# Patient Record
Sex: Female | Born: 1960 | Race: White | Hispanic: No | Smoking: Former smoker
Health system: Southern US, Community
[De-identification: ages and names within clinical notes are randomized; demographics above are authoritative.]

## PROBLEM LIST (undated history)

## (undated) DIAGNOSIS — F32A Depression, unspecified: Secondary | ICD-10-CM

## (undated) DIAGNOSIS — I10 Essential (primary) hypertension: Secondary | ICD-10-CM

## (undated) DIAGNOSIS — F329 Major depressive disorder, single episode, unspecified: Secondary | ICD-10-CM

## (undated) DIAGNOSIS — E119 Type 2 diabetes mellitus without complications: Secondary | ICD-10-CM

## (undated) DIAGNOSIS — E785 Hyperlipidemia, unspecified: Secondary | ICD-10-CM

## (undated) HISTORY — DX: Major depressive disorder, single episode, unspecified: F32.9

## (undated) HISTORY — DX: Hyperlipidemia, unspecified: E78.5

## (undated) HISTORY — DX: Type 2 diabetes mellitus without complications: E11.9

## (undated) HISTORY — DX: Depression, unspecified: F32.A

## (undated) HISTORY — DX: Essential (primary) hypertension: I10

## (undated) HISTORY — PX: ECTOPIC PREGNANCY SURGERY: SHX613

---

## 2017-10-04 ENCOUNTER — Encounter: Payer: Self-pay | Admitting: Physician Assistant

## 2017-10-04 ENCOUNTER — Other Ambulatory Visit: Payer: Self-pay | Admitting: Physician Assistant

## 2017-10-04 ENCOUNTER — Ambulatory Visit: Payer: Self-pay | Admitting: Physician Assistant

## 2017-10-04 VITALS — BP 160/90 | HR 83 | Temp 97.9°F | Ht 61.0 in | Wt 181.0 lb

## 2017-10-04 DIAGNOSIS — M25511 Pain in right shoulder: Secondary | ICD-10-CM

## 2017-10-04 DIAGNOSIS — E669 Obesity, unspecified: Secondary | ICD-10-CM

## 2017-10-04 DIAGNOSIS — Z1239 Encounter for other screening for malignant neoplasm of breast: Secondary | ICD-10-CM

## 2017-10-04 DIAGNOSIS — I10 Essential (primary) hypertension: Secondary | ICD-10-CM

## 2017-10-04 DIAGNOSIS — G8929 Other chronic pain: Secondary | ICD-10-CM

## 2017-10-04 DIAGNOSIS — E785 Hyperlipidemia, unspecified: Secondary | ICD-10-CM

## 2017-10-04 DIAGNOSIS — Z7689 Persons encountering health services in other specified circumstances: Secondary | ICD-10-CM

## 2017-10-04 DIAGNOSIS — E1165 Type 2 diabetes mellitus with hyperglycemia: Secondary | ICD-10-CM

## 2017-10-04 DIAGNOSIS — Z1211 Encounter for screening for malignant neoplasm of colon: Secondary | ICD-10-CM

## 2017-10-04 DIAGNOSIS — Z87891 Personal history of nicotine dependence: Secondary | ICD-10-CM

## 2017-10-04 MED ORDER — ATENOLOL 25 MG PO TABS: 25.0000 mg | ORAL_TABLET | Freq: Every day | ORAL | 1 refills | Status: DC

## 2017-10-04 NOTE — Progress Notes (Signed)
BP (!) 160/90 (BP Location: Left Arm, Patient Position: Sitting, Cuff Size: Normal)   Pulse 83   Temp 97.9 F (36.6 C)   Ht 5\' 1"  (1.549 m)   Wt 181 lb (82.1 kg)   SpO2 98%   BMI 34.20 kg/m    Subjective:    Patient ID: Ashley Chase, female    DOB: [birth year], 57 y.o.   MRN: [id number]  HPI: Ashley Chase is a 57 y.o. female presenting on 10/04/2017 for New Patient (Initial Visit) (pt moved from [facility] 3 months ago. pt has been out of BP med for 3 weeks) and Shoulder Pain (Left shoulder for 5 months. pt states she feels pain is getting worse)   HPI   Chief Complaint  Patient presents with  . New Patient (Initial Visit)    pt moved from [facility] 3 months ago. pt has been out of BP med for 3 weeks  . Shoulder Pain    Left shoulder for 5 months. 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  She does not have insurance.  Pt states on victoza and januvia for about a year.  She states recently am fbs about 167.    She was taken off the metformin when she started the other two.  She doesn't know why it was stopped.   She thinks her last a1c was 11.1 in November or December.    She says she did well on the 500 but had some diarrhea when they increased it to 1g bid.    Pt has never had a colonoscopy.   She says last PAP 2 years ago and it was normal.  Last mammogram 1 year ago was normal.    She takes aleve at night for her shoulder.   She is using ice and heat.    Relevant past medical, surgical, family and social history reviewed and updated as indicated. Interim medical history since our last visit reviewed. Allergies and medications reviewed and updated.   Current Outpatient Medications:  .  amLODipine (NORVASC) 5 MG tablet, Take 5 mg by mouth daily., Disp: , Rfl:  .  atorvastatin (LIPITOR) 10 MG tablet, Take 10 mg by mouth daily., Disp: , Rfl:  .  liraglutide (VICTOZA) 18 MG/3ML SOPN, Inject 1.8 mg into the skin daily., Disp: , Rfl:  .   lisinopril-hydrochlorothiazide (PRINZIDE,ZESTORETIC) 20-25 MG tablet, Take 1 tablet by mouth daily., Disp: , Rfl:  .  atenolol (TENORMIN) 25 MG tablet, Take 25 mg by mouth daily., Disp: , Rfl:  .  sitaGLIPtin (JANUVIA) 100 MG tablet, Take 100 mg by mouth daily., Disp: , Rfl:    Review of Systems  Constitutional: Negative for appetite change, chills, diaphoresis, fatigue, fever and unexpected weight change.  HENT: Negative for congestion, dental problem, drooling, ear pain, facial swelling, hearing loss, mouth sores, sneezing, sore throat, trouble swallowing and voice change.   Eyes: Positive for itching. Negative for pain, discharge, redness and visual disturbance.  Respiratory: Negative for cough, choking, shortness of breath and wheezing.   Cardiovascular: Negative for chest pain, palpitations and leg swelling.  Gastrointestinal: Negative for abdominal pain, blood in stool, constipation, diarrhea and vomiting.  Endocrine: Negative for cold intolerance, heat intolerance and polydipsia.  Genitourinary: Negative for decreased urine volume, dysuria and hematuria.  Musculoskeletal: Negative for arthralgias, back pain and gait problem.  Skin: Negative for rash.  Allergic/Immunologic: Negative for environmental allergies.  Neurological: Negative for seizures,  syncope, light-headedness and headaches.  Hematological: Negative for adenopathy.  Psychiatric/Behavioral: Negative for agitation, dysphoric mood and suicidal ideas. The patient is nervous/anxious.     Per HPI unless specifically indicated above     Objective:    BP (!) 160/90 (BP Location: Left Arm, Patient Position: Sitting, Cuff Size: Normal)   Pulse 83   Temp 97.9 F (36.6 C)   Ht 5\' 1"  (1.549 m)   Wt 181 lb (82.1 kg)   SpO2 98%   BMI 34.20 kg/m   Wt Readings from Last 3 Encounters:  10/04/17 181 lb (82.1 kg)    Physical Exam  Constitutional: She is oriented to person, place, and time. She appears well-developed and  well-nourished.  HENT:  Head: Normocephalic and atraumatic.  Mouth/Throat: Oropharynx is clear and moist. No oropharyngeal exudate.  Dry flaking skin B ear canals  Eyes: Pupils are equal, round, and reactive to light. Conjunctivae and EOM are normal.  Neck: Neck supple. No thyromegaly present.  Cardiovascular: Normal rate and regular rhythm.  Pulmonary/Chest: Effort normal and breath sounds normal.  Abdominal: Soft. Bowel sounds are normal. She exhibits no mass. There is no hepatosplenomegaly. There is no tenderness.  Musculoskeletal: She exhibits no edema.  Lymphadenopathy:    She has no cervical adenopathy.  Neurological: She is alert and oriented to person, place, and time. Gait normal.  Skin: Skin is warm and dry.  Psychiatric: She has a normal mood and affect. Her behavior is normal.  Vitals reviewed.   No results found for this or any previous visit.    Assessment & Plan:    Encounter Diagnoses  Name Primary?  . Encounter to establish care Yes  . Uncontrolled type 2 diabetes mellitus with hyperglycemia (HCC)   . Essential hypertension   . Hyperlipidemia, unspecified hyperlipidemia type   . Obesity, unspecified classification, unspecified obesity type, unspecified whether serious comorbidity present   . Screening for colon cancer   . History of cigarette smoking   . Screening for breast cancer   . Chronic right shoulder pain     -will get fasting baseline Labs -pt was given iFOBT for colon cancer screening. -ordered screening Mammogram -pt is signed up for medassist -[facility], mammogram and shoulder xray -pt was given refill of atenolol.  She says she has enough of her other meds to last until next OV.  -Follow up 1 month.  RTO sooner prn

## 2017-10-05 ENCOUNTER — Other Ambulatory Visit: Payer: Self-pay | Admitting: Physician Assistant

## 2017-10-05 DIAGNOSIS — Z1231 Encounter for screening mammogram for malignant neoplasm of breast: Secondary | ICD-10-CM

## 2017-10-18 ENCOUNTER — Other Ambulatory Visit (HOSPITAL_COMMUNITY)
Admission: RE | Admit: 2017-10-18 | Discharge: 2017-10-18 | Disposition: A | Discharge status: Home / Self Care | Payer: Self-pay | Source: Ambulatory Visit | Attending: Physician Assistant | Admitting: Physician Assistant

## 2017-10-18 DIAGNOSIS — E669 Obesity, unspecified: Secondary | ICD-10-CM | POA: Insufficient documentation

## 2017-10-18 DIAGNOSIS — E785 Hyperlipidemia, unspecified: Secondary | ICD-10-CM | POA: Insufficient documentation

## 2017-10-18 DIAGNOSIS — I10 Essential (primary) hypertension: Secondary | ICD-10-CM | POA: Insufficient documentation

## 2017-10-18 DIAGNOSIS — E1165 Type 2 diabetes mellitus with hyperglycemia: Secondary | ICD-10-CM | POA: Insufficient documentation

## 2017-10-18 LAB — COMPREHENSIVE METABOLIC PANEL
ALK PHOS: 153 U/L — AB (ref 38–126)
ALT: 30 U/L (ref 14–54)
AST: 26 U/L (ref 15–41)
Albumin: 4.2 g/dL (ref 3.5–5.0)
Anion gap: 11 (ref 5–15)
BILIRUBIN TOTAL: 0.5 mg/dL (ref 0.3–1.2)
BUN: 17 mg/dL (ref 6–20)
CALCIUM: 9.2 mg/dL (ref 8.9–10.3)
CO2: 28 mmol/L (ref 22–32)
Chloride: 100 mmol/L — ABNORMAL LOW (ref 101–111)
Creatinine, Ser: 0.59 mg/dL (ref 0.44–1.00)
GLUCOSE: 197 mg/dL — AB (ref 65–99)
POTASSIUM: 4.7 mmol/L (ref 3.5–5.1)
Sodium: 139 mmol/L (ref 135–145)
TOTAL PROTEIN: 7.5 g/dL (ref 6.5–8.1)

## 2017-10-18 LAB — LIPID PANEL
CHOL/HDL RATIO: 4.6 ratio
Cholesterol: 166 mg/dL (ref 0–200)
HDL: 36 mg/dL — AB (ref >40)
LDL Cholesterol: 77 mg/dL (ref 0–99)
TRIGLYCERIDES: 265 mg/dL — AB (ref <150)
VLDL: 53 mg/dL — ABNORMAL HIGH (ref 0–40)

## 2017-10-18 LAB — HEMOGLOBIN A1C
HEMOGLOBIN A1C: 10 % — AB (ref 4.8–5.6)
MEAN PLASMA GLUCOSE: 240.3 mg/dL

## 2017-10-18 LAB — CBC WITH DIFFERENTIAL/PLATELET
Basophils Absolute: 0 10*3/uL (ref 0.0–0.1)
Basophils Relative: 0 %
EOS ABS: 0.2 10*3/uL (ref 0.0–0.7)
EOS PCT: 3 %
HCT: 42.8 % (ref 36.0–46.0)
Hemoglobin: 13.5 g/dL (ref 12.0–15.0)
LYMPHS ABS: 2 10*3/uL (ref 0.7–4.0)
LYMPHS PCT: 25 %
MCH: 27.4 pg (ref 26.0–34.0)
MCHC: 31.5 g/dL (ref 30.0–36.0)
MCV: 87 fL (ref 78.0–100.0)
MONO ABS: 0.4 10*3/uL (ref 0.1–1.0)
Monocytes Relative: 5 %
Neutro Abs: 5.5 10*3/uL (ref 1.7–7.7)
Neutrophils Relative %: 67 %
PLATELETS: 181 10*3/uL (ref 150–400)
RBC: 4.92 MIL/uL (ref 3.87–5.11)
RDW: 14.1 % (ref 11.5–15.5)
WBC: 8.2 10*3/uL (ref 4.0–10.5)

## 2017-10-18 LAB — IFOBT (OCCULT BLOOD): IMMUNOLOGICAL FECAL OCCULT BLOOD TEST: NEGATIVE

## 2017-10-18 LAB — TSH: TSH: 5.823 u[IU]/mL — AB (ref 0.350–4.500)

## 2017-10-19 ENCOUNTER — Other Ambulatory Visit: Payer: Self-pay | Admitting: Physician Assistant

## 2017-10-19 LAB — MICROALBUMIN, URINE: MICROALB UR: 6.4 ug/mL — AB

## 2017-10-19 MED ORDER — SITAGLIPTIN PHOSPHATE 100 MG PO TABS: 100.0000 mg | ORAL_TABLET | Freq: Every day | ORAL | 1 refills | Status: DC

## 2017-10-19 MED ORDER — LISINOPRIL-HYDROCHLOROTHIAZIDE 20-25 MG PO TABS: 1.0000 | ORAL_TABLET | Freq: Every day | ORAL | 1 refills | Status: DC

## 2017-10-19 MED ORDER — METFORMIN HCL 500 MG PO TABS: 500.0000 mg | ORAL_TABLET | Freq: Two times a day (BID) | ORAL | 1 refills | Status: DC

## 2017-10-19 MED ORDER — ATORVASTATIN CALCIUM 10 MG PO TABS: 10.0000 mg | ORAL_TABLET | Freq: Every day | ORAL | 1 refills | Status: DC

## 2017-11-09 ENCOUNTER — Ambulatory Visit: Payer: Self-pay | Admitting: Physician Assistant

## 2017-11-09 ENCOUNTER — Encounter: Payer: Self-pay | Admitting: Physician Assistant

## 2017-11-09 VITALS — BP 120/72 | HR 75 | Temp 97.9°F | Ht 61.0 in | Wt 184.8 lb

## 2017-11-09 DIAGNOSIS — Z1239 Encounter for other screening for malignant neoplasm of breast: Secondary | ICD-10-CM

## 2017-11-09 DIAGNOSIS — I10 Essential (primary) hypertension: Secondary | ICD-10-CM

## 2017-11-09 DIAGNOSIS — E785 Hyperlipidemia, unspecified: Secondary | ICD-10-CM

## 2017-11-09 DIAGNOSIS — E669 Obesity, unspecified: Secondary | ICD-10-CM

## 2017-11-09 DIAGNOSIS — E1165 Type 2 diabetes mellitus with hyperglycemia: Secondary | ICD-10-CM

## 2017-11-09 MED ORDER — INSULIN GLARGINE 100 UNIT/ML SOLOSTAR PEN: 15.0000 [IU] | PEN_INJECTOR | Freq: Every day | SUBCUTANEOUS | 99 refills | Status: DC

## 2017-11-09 MED ORDER — FISH OIL 1000 MG PO CAPS: ORAL_CAPSULE | ORAL | 0 refills | Status: AC

## 2017-11-09 NOTE — Progress Notes (Signed)
BP 120/72 (BP Location: Left Arm, Patient Position: Sitting, Cuff Size: Normal)   Pulse 75   Temp 97.9 F (36.6 C)   Wt 184 lb 12 oz (83.8 kg)   SpO2 99%   BMI 34.91 kg/m    Subjective:    Patient ID: Ashley Chase, female    DOB: [birth year], 57 y.o.   MRN: [id number]  HPI: Ashley Chase is a 57 y.o. female presenting on 11/09/2017 for Follow-up   HPI  Pt now says it's been "a while" since her last pap and mammogram. [facility] to [facility] in [place], [place]- states they never did PAP or mammogram on pt.  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   Pt says shoulder better with aleve.   Relevant past medical, surgical, family and social history reviewed and updated as indicated. Interim medical history since our last visit reviewed. Allergies and medications reviewed and updated.   Current Outpatient Medications:  .  amLODipine (NORVASC) 5 MG tablet, Take 5 mg by mouth daily., Disp: , Rfl:  .  atenolol (TENORMIN) 25 MG tablet, Take 1 tablet (25 mg total) by mouth daily., Disp: 30 tablet, Rfl: 1 .  atorvastatin (LIPITOR) 10 MG tablet, Take 1 tablet (10 mg total) by mouth daily., Disp: 90 tablet, Rfl: 1 .  liraglutide (VICTOZA) 18 MG/3ML SOPN, Inject 1.8 mg into the skin daily., Disp: , Rfl:  .  lisinopril-hydrochlorothiazide (PRINZIDE,ZESTORETIC) 20-25 MG tablet, Take 1 tablet by mouth daily., Disp: 90 tablet, Rfl: 1 .  metFORMIN (GLUCOPHAGE) 500 MG tablet, Take 1 tablet (500 mg total) by mouth 2 (two) times daily with a meal., Disp: 180 tablet, Rfl: 1 .  naproxen sodium (ALEVE) 220 MG tablet, Take 220 mg by mouth 2 (two) times daily as needed., Disp: , Rfl:  .  sitaGLIPtin (JANUVIA) 100 MG tablet, Take 1 tablet (100 mg total) by mouth daily., Disp: 90 tablet, Rfl: 1   Review of Systems  Constitutional: Negative for appetite change, chills, diaphoresis, fatigue, fever and unexpected weight change.  HENT: Negative for congestion, dental  problem, drooling, ear pain, facial swelling, hearing loss, mouth sores, sneezing, sore throat, trouble swallowing and voice change.   Eyes: Negative for pain, discharge, redness, itching and visual disturbance.  Respiratory: Negative for cough, choking, shortness of breath and wheezing.   Cardiovascular: Negative for chest pain, palpitations and leg swelling.  Gastrointestinal: Negative for abdominal pain, blood in stool, constipation, diarrhea and vomiting.  Endocrine: Negative for cold intolerance, heat intolerance and polydipsia.  Genitourinary: Negative for decreased urine volume, dysuria and hematuria.  Musculoskeletal: Negative for arthralgias, back pain and gait problem.  Skin: Negative for rash.  Allergic/Immunologic: Negative for environmental allergies.  Neurological: Negative for seizures, syncope, light-headedness and headaches.  Hematological: Negative for adenopathy.  Psychiatric/Behavioral: Negative for agitation, dysphoric mood and suicidal ideas. The patient is not nervous/anxious.     Per HPI unless specifically indicated above     Objective:    BP 120/72 (BP Location: Left Arm, Patient Position: Sitting, Cuff Size: Normal)   Pulse 75   Temp 97.9 F (36.6 C)   Wt 184 lb 12 oz (83.8 kg)   SpO2 99%   BMI 34.91 kg/m   Wt Readings from Last 3 Encounters:  11/09/17 184 lb 12 oz (83.8 kg)  10/04/17 181 lb (82.1 kg)    Physical Exam  Constitutional: She is oriented to person, place, and time. She appears well-developed and well-nourished.  HENT:  Head:  Normocephalic and atraumatic.  Neck: Neck supple.  Cardiovascular: Normal rate and regular rhythm.  Pulmonary/Chest: Effort normal and breath sounds normal.  Abdominal: Soft. Bowel sounds are normal. She exhibits no mass. There is no hepatosplenomegaly. There is no tenderness.  Musculoskeletal: She exhibits no edema.  Lymphadenopathy:    She has no cervical adenopathy.  Neurological: She is alert and oriented to  person, place, and time.  Skin: Skin is warm and dry.  Psychiatric: She has a normal mood and affect. Her behavior is normal.  Vitals reviewed.   Results for orders placed or performed during the hospital encounter of 10/18/17  TSH  Result Value Ref Range   TSH 5.823 (H) 0.350 - 4.500 uIU/mL  Lipid panel  Result Value Ref Range   Cholesterol 166 0 - 200 mg/dL   Triglycerides 295 (H) <150 mg/dL   HDL 36 (L) >62 mg/dL   Total CHOL/HDL Ratio 4.6 RATIO   VLDL 53 (H) 0 - 40 mg/dL   LDL Cholesterol 77 0 - 99 mg/dL  CBC w/Diff/Platelet  Result Value Ref Range   WBC 8.2 4.0 - 10.5 K/uL   RBC 4.92 3.87 - 5.11 MIL/uL   Hemoglobin 13.5 12.0 - 15.0 g/dL   HCT 13.0 86.5 - 78.4 %   MCV 87.0 78.0 - 100.0 fL   MCH 27.4 26.0 - 34.0 pg   MCHC 31.5 30.0 - 36.0 g/dL   RDW 69.6 29.5 - 28.4 %   Platelets 181 150 - 400 K/uL   Neutrophils Relative % 67 %   Neutro Abs 5.5 1.7 - 7.7 K/uL   Lymphocytes Relative 25 %   Lymphs Abs 2.0 0.7 - 4.0 K/uL   Monocytes Relative 5 %   Monocytes Absolute 0.4 0.1 - 1.0 K/uL   Eosinophils Relative 3 %   Eosinophils Absolute 0.2 0.0 - 0.7 K/uL   Basophils Relative 0 %   Basophils Absolute 0.0 0.0 - 0.1 K/uL  Comprehensive metabolic panel  Result Value Ref Range   Sodium 139 135 - 145 mmol/L   Potassium 4.7 3.5 - 5.1 mmol/L   Chloride 100 (L) 101 - 111 mmol/L   CO2 28 22 - 32 mmol/L   Glucose, Bld 197 (H) 65 - 99 mg/dL   BUN 17 6 - 20 mg/dL   Creatinine, Ser 1.32 0.44 - 1.00 mg/dL   Calcium 9.2 8.9 - 44.0 mg/dL   Total Protein 7.5 6.5 - 8.1 g/dL   Albumin 4.2 3.5 - 5.0 g/dL   AST 26 15 - 41 U/L   ALT 30 14 - 54 U/L   Alkaline Phosphatase 153 (H) 38 - 126 U/L   Total Bilirubin 0.5 0.3 - 1.2 mg/dL   GFR calc non Af Amer >60 >60 mL/min   GFR calc Af Amer >60 >60 mL/min   Anion gap 11 5 - 15  Hemoglobin A1c  Result Value Ref Range   Hgb A1c MFr Bld 10.0 (H) 4.8 - 5.6 %   Mean Plasma Glucose 240.3 mg/dL  Microalbumin, urine  Result Value Ref Range    Microalb, Ur 6.4 (H) Not Estab. ug/mL      Assessment & Plan:   Encounter Diagnoses  Name Primary?  Marland Kitchen Uncontrolled type 2 diabetes mellitus with hyperglycemia (HCC) Yes  . Essential hypertension   . Hyperlipidemia, unspecified hyperlipidemia type   . Obesity, unspecified classification, unspecified obesity type, unspecified whether serious comorbidity present   . Screening for breast cancer     -reviewed labs with  pt -Add fish oil 1 daily. Continue lowfat diet and atorvastatin -pt will Stop victoza.  Add lantus.  Start at 15u qhs.  Pt was taught proper technique to give the lantus as well as given reading information.  Pt to monitor fbs and bring log to follow up appointment in 1 month.  Pt to call office for fbs < 70 or > 300 -will monitor thyroid function.  No medication at this time -Ordered screening mammogram -will plan to update PAP in few months -pt to Follow up 1 months with BS log.  RTO sooner prn

## 2017-11-09 NOTE — Patient Instructions (Addendum)
Insulin Injection Instructions, Using Insulin Pens, Adult A subcutaneous injection is a shot of medicine that is injected into the layer of fat between skin and muscle. People with type 1 diabetes must take insulin because their bodies do not make it. People with type 2 diabetes may need to take insulin. There are many different types of insulin. The type of insulin that you take may determine how many injections you give yourself and when you need to take the injections. Choosing a site for injection Insulin absorption varies from site to site. It is best to inject insulin within the same body area, using a different spot in that area for each injection. Do not inject the insulin in the same spot for each injection. There are five main areas that can be used for injecting. These areas include:  Abdomen. This is the preferred area.  Front of thigh.  Upper, outer side of thigh.  Back of upper arm.  Buttocks.  Using an insulin pen First, follow the steps for Getting Ready, then continue with the steps for Injecting the Insulin. Getting Ready 1. Wash your hands with soap and water. If soap and water are not available, use hand sanitizer. 2. Check the expiration date and type of insulin in the pen. 3. If you are using CLEAR insulin, check to see that it is clear and free of clumps. 4. If you are using CLOUDY insulin, gently roll the pen between your palms several times, or tip the pen up and down several times to mix up the medicine. Do not shake the pen. 5. Remove the cap from the insulin pen. 6. Use an alcohol wipe to clean the rubber stopper of the pen cartridge. 7. Remove the protective paper tab from the disposable needle. Do not let the needle touch anything. 8. Screw the needle onto the pen. 9. Remove the outer and inner plastic covers from the needle. Do not throw away the outer plastic cover yet. 10. Prime the insulin pen by turning the button (dial) to 2 units. Hold the pen with the  needle pointing up, and push the button on the opposite end of the pen until a drop of insulin appears at the needle tip. If no insulin appears, repeat this step. 11. Dial the number of units of insulin that you will be injecting. Injecting the Insulin  1. Use an alcohol wipe to clean the site where you will be injecting the needle. Let the site air-dry. 2. Hold the pen in the palm of your writing hand with your thumb on the top. 3. If directed by your health care provider, use your other hand to pinch and hold about an inch of skin at the injection site. Do not directly touch the cleaned part of the skin. 4. Gently but quickly, put the needle straight into the skin. The needle should be at a 90-degree angle (perpendicular) to the skin, as if to form the letter "L." ? For example, if you are giving an injection in the abdomen, the abdomen forms one "leg" of the "L" and the needle forms the other "leg" of the "L." 5. For adults who have a small amount of body fat, the needle may need to be injected at a 45-degree angle instead. Your health care provider will tell you if this is necessary. ? A 45-degree angle looks like the letter "V." 6. When the needle is completely inserted into the skin, use the thumb of your writing hand to push the top   button of the pen down all the way to inject the insulin. 7. Let go of the skin that you are pinching. Continue to hold the pen in place with your writing hand. 8. Wait five seconds, then pull the needle straight out of the skin. 9. Carefully put the larger (outer) plastic cover of the needle back over the needle, then unscrew the capped needle and discard it in a sharps container, such as an empty plastic bottle with a cover. 10. Put the plastic cap back on the insulin pen. Throwing away supplies  Discard all used needles in a puncture-proof sharps disposal container. You can ask your local pharmacy about where you can get this kind of disposal container, or you  can use an empty liquid laundry detergent bottle that has a cover.  Follow the disposal regulations for the area where you live. Do not use any needle more than one time.  Throw away empty disposable pens in the regular trash. What questions should I ask my health care provider?  How often should I be taking insulin?  How often should I check my blood glucose?  What amount of insulin should I be taking at each time?  What are the side effects?  What should I do if my blood glucose is too high?  What should I do if my blood glucose is too low?  What should I do if I forget to take my insulin?  What number should I call if I have questions? Where can I get more information?  American Diabetes Association (ADA): www.diabetes.org  American Association of Diabetes Educators (AADE) Patient Resources: https://www.diabeteseducator.org/patient-resources This information is not intended to replace advice given to you by your health care provider. Make sure you discuss any questions you have with your health care provider. Document Released: 07/24/2015 Document Revised: 11/26/2015 Document Reviewed: 07/24/2015 Elsevier Interactive Patient Education  2018 [employer]vier Inc.  

## 2017-11-28 ENCOUNTER — Encounter: Payer: Self-pay | Admitting: Physician Assistant

## 2017-12-11 ENCOUNTER — Encounter: Payer: Self-pay | Admitting: Radiology

## 2017-12-11 ENCOUNTER — Ambulatory Visit
Admission: RE | Admit: 2017-12-11 | Discharge: 2017-12-11 | Disposition: A | Discharge status: Home / Self Care | Payer: PRIVATE HEALTH INSURANCE | Source: Ambulatory Visit | Attending: Physician Assistant | Admitting: Physician Assistant

## 2017-12-11 DIAGNOSIS — Z1231 Encounter for screening mammogram for malignant neoplasm of breast: Secondary | ICD-10-CM

## 2017-12-13 ENCOUNTER — Encounter: Payer: Self-pay | Admitting: Physician Assistant

## 2017-12-13 ENCOUNTER — Ambulatory Visit: Payer: Self-pay | Admitting: Physician Assistant

## 2017-12-13 VITALS — BP 110/78 | HR 67 | Temp 97.7°F | Ht 61.0 in | Wt 183.8 lb

## 2017-12-13 DIAGNOSIS — R7989 Other specified abnormal findings of blood chemistry: Secondary | ICD-10-CM

## 2017-12-13 DIAGNOSIS — I10 Essential (primary) hypertension: Secondary | ICD-10-CM

## 2017-12-13 DIAGNOSIS — E669 Obesity, unspecified: Secondary | ICD-10-CM

## 2017-12-13 DIAGNOSIS — E785 Hyperlipidemia, unspecified: Secondary | ICD-10-CM

## 2017-12-13 DIAGNOSIS — E1165 Type 2 diabetes mellitus with hyperglycemia: Secondary | ICD-10-CM

## 2017-12-13 MED ORDER — AMLODIPINE BESYLATE 5 MG PO TABS: 5.0000 mg | ORAL_TABLET | Freq: Every day | ORAL | 2 refills | Status: DC

## 2017-12-13 NOTE — Progress Notes (Signed)
BP 110/78 (BP Location: Left Arm, Patient Position: Sitting, Cuff Size: Normal)   Pulse 67   Temp 97.7 F (36.5 C)   Ht 5\' 1"  (1.549 m)   Wt 183 lb 12 oz (83.3 kg)   SpO2 98%   BMI 34.72 kg/m    Subjective:    Patient ID: Ashley Chase, female    DOB: [birth year], 57 y.o.   MRN: [id number]  HPI: Ashley Chase is a 57 y.o. female presenting on 12/13/2017 for Diabetes   HPI   Pt had her mammogram  Pt has bs log.  Most in 170s and 180s.  No lows.  A few over 200.    No complaints today  Relevant past medical, surgical, family and social history reviewed and updated as indicated. Interim medical history since our last visit reviewed. Allergies and medications reviewed and updated.   Current Outpatient Medications:  .  amLODipine (NORVASC) 5 MG tablet, Take 5 mg by mouth daily., Disp: , Rfl:  .  atenolol (TENORMIN) 25 MG tablet, Take 1 tablet (25 mg total) by mouth daily., Disp: 30 tablet, Rfl: 1 .  atorvastatin (LIPITOR) 10 MG tablet, Take 1 tablet (10 mg total) by mouth daily., Disp: 90 tablet, Rfl: 1 .  Insulin Glargine (LANTUS SOLOSTAR) 100 UNIT/ML Solostar Pen, Inject 15 Units into the skin at bedtime., Disp: 5 pen, Rfl: PRN .  lisinopril-hydrochlorothiazide (PRINZIDE,ZESTORETIC) 20-25 MG tablet, Take 1 tablet by mouth daily., Disp: 90 tablet, Rfl: 1 .  metFORMIN (GLUCOPHAGE) 500 MG tablet, Take 1 tablet (500 mg total) by mouth 2 (two) times daily with a meal., Disp: 180 tablet, Rfl: 1 .  naproxen sodium (ALEVE) 220 MG tablet, Take 220 mg by mouth 2 (two) times daily as needed., Disp: , Rfl:  .  Omega-3 Fatty Acids (FISH OIL) 1000 MG CAPS, 1 po qd, Disp: , Rfl: 0 .  sitaGLIPtin (JANUVIA) 100 MG tablet, Take 1 tablet (100 mg total) by mouth daily., Disp: 90 tablet, Rfl: 1  Review of Systems  Constitutional: Negative for appetite change, chills, diaphoresis, fatigue, fever and unexpected weight change.  HENT: Negative for congestion, dental problem, drooling, ear pain, facial  swelling, hearing loss, mouth sores, sneezing, sore throat, trouble swallowing and voice change.   Eyes: Positive for visual disturbance. Negative for pain, discharge, redness and itching.  Respiratory: Negative for cough, choking, shortness of breath and wheezing.   Cardiovascular: Negative for chest pain, palpitations and leg swelling.  Gastrointestinal: Negative for abdominal pain, blood in stool, constipation, diarrhea and vomiting.  Endocrine: Negative for cold intolerance, heat intolerance and polydipsia.  Genitourinary: Negative for decreased urine volume, dysuria and hematuria.  Musculoskeletal: Negative for arthralgias, back pain and gait problem.  Skin: Negative for rash.  Allergic/Immunologic: Negative for environmental allergies.  Neurological: Negative for seizures, syncope, light-headedness and headaches.  Hematological: Negative for adenopathy.  Psychiatric/Behavioral: Negative for agitation, dysphoric mood and suicidal ideas. The patient is not nervous/anxious.     Per HPI unless specifically indicated above     Objective:    BP 110/78 (BP Location: Left Arm, Patient Position: Sitting, Cuff Size: Normal)   Pulse 67   Temp 97.7 F (36.5 C)   Ht 5\' 1"  (1.549 m)   Wt 183 lb 12 oz (83.3 kg)   SpO2 98%   BMI 34.72 kg/m   Wt Readings from Last 3 Encounters:  12/13/17 183 lb 12 oz (83.3 kg)  11/09/17 184 lb 12 oz (83.8 kg)  10/04/17 181 lb (82.1 kg)  Physical Exam  Constitutional: She is oriented to person, place, and time. She appears well-developed and well-nourished.  HENT:  Head: Normocephalic and atraumatic.  Neck: Neck supple.  Cardiovascular: Normal rate and regular rhythm.  Pulmonary/Chest: Effort normal and breath sounds normal.  Abdominal: Soft. Bowel sounds are normal. She exhibits no mass. There is no hepatosplenomegaly. There is no tenderness.  Musculoskeletal: She exhibits no edema.  Lymphadenopathy:    She has no cervical adenopathy.   Neurological: She is alert and oriented to person, place, and time.  Skin: Skin is warm and dry.  Psychiatric: She has a normal mood and affect. Her behavior is normal.  Vitals reviewed.       Assessment & Plan:    Encounter Diagnoses  Name Primary?  Marland Kitchen Uncontrolled type 2 diabetes mellitus with hyperglycemia (HCC) Yes  . Essential hypertension   . Hyperlipidemia, unspecified hyperlipidemia type   . Obesity, unspecified classification, unspecified obesity type, unspecified whether serious comorbidity present   . Abnormal thyroid blood test     -Refer for diabetic eye exam -Increase lantus to 20units qhs.  Pt will continue to monitor fbs.  Pt reminded to call office for fbs < 70 or > 300 -offered to update PAP at next OV but pt prefers to put that off for a while longer -pt to follow up in 6 weeks.  RTO sooner prn

## 2017-12-21 ENCOUNTER — Ambulatory Visit: Payer: Self-pay | Admitting: Physician Assistant

## 2017-12-21 ENCOUNTER — Encounter: Payer: Self-pay | Admitting: Physician Assistant

## 2017-12-21 VITALS — BP 125/80 | HR 74 | Temp 97.7°F | Ht 61.0 in | Wt 185.0 lb

## 2017-12-21 DIAGNOSIS — T148XXA Other injury of unspecified body region, initial encounter: Secondary | ICD-10-CM

## 2017-12-21 DIAGNOSIS — L089 Local infection of the skin and subcutaneous tissue, unspecified: Secondary | ICD-10-CM

## 2017-12-21 DIAGNOSIS — G8929 Other chronic pain: Secondary | ICD-10-CM

## 2017-12-21 DIAGNOSIS — M25511 Pain in right shoulder: Principal | ICD-10-CM

## 2017-12-21 MED ORDER — CEPHALEXIN 500 MG PO CAPS: 500.0000 mg | ORAL_CAPSULE | Freq: Three times a day (TID) | ORAL | 0 refills | Status: DC

## 2017-12-21 MED ORDER — DICLOFENAC SODIUM 75 MG PO TBEC: 75.0000 mg | DELAYED_RELEASE_TABLET | Freq: Two times a day (BID) | ORAL | 0 refills | Status: DC | PRN

## 2017-12-21 NOTE — Patient Instructions (Signed)
Shoulder Exercises Ask your health care provider which exercises are safe for you. Do exercises exactly as told by your health care provider and adjust them as directed. It is normal to feel mild stretching, pulling, tightness, or discomfort as you do these exercises, but you should stop right away if you feel sudden pain or your pain gets worse.Do not begin these exercises until told by your health care provider. RANGE OF MOTION EXERCISES These exercises warm up your muscles and joints and improve the movement and flexibility of your shoulder. These exercises also help to relieve pain, numbness, and tingling. These exercises involve stretching your injured shoulder directly. Exercise A: Pendulum  1. Stand near a wall or a surface that you can hold onto for balance. 2. Bend at the waist and let your left / right arm hang straight down. Use your other arm to support you. Keep your back straight and do not lock your knees. 3. Relax your left / right arm and shoulder muscles, and move your hips and your trunk so your left / right arm swings freely. Your arm should swing because of the motion of your body, not because you are using your arm or shoulder muscles. 4. Keep moving your body so your arm swings in the following directions, as told by your health care provider: ? Side to side. ? Forward and backward. ? In clockwise and counterclockwise circles. 5. Continue each motion for __________ seconds, or for as long as told by your health care provider. 6. Slowly return to the starting position. Repeat __________ times. Complete this exercise __________ times a day. Exercise B:Flexion, Standing  1. Stand and hold a broomstick, a cane, or a similar object. Place your hands a little more than shoulder-width apart on the object. Your left / right hand should be palm-up, and your other hand should be palm-down. 2. Keep your elbow straight and keep your shoulder muscles relaxed. Push the stick down with  your healthy arm to raise your left / right arm in front of your body, and then over your head until you feel a stretch in your shoulder. ? Avoid shrugging your shoulder while you raise your arm. Keep your shoulder blade tucked down toward the middle of your back. 3. Hold for __________ seconds. 4. Slowly return to the starting position. Repeat __________ times. Complete this exercise __________ times a day. Exercise C: Abduction, Standing 1. Stand and hold a broomstick, a cane, or a similar object. Place your hands a little more than shoulder-width apart on the object. Your left / right hand should be palm-up, and your other hand should be palm-down. 2. While keeping your elbow straight and your shoulder muscles relaxed, push the stick across your body toward your left / right side. Raise your left / right arm to the side of your body and then over your head until you feel a stretch in your shoulder. ? Do not raise your arm above shoulder height, unless your health care provider tells you to do that. ? Avoid shrugging your shoulder while you raise your arm. Keep your shoulder blade tucked down toward the middle of your back. 3. Hold for __________ seconds. 4. Slowly return to the starting position. Repeat __________ times. Complete this exercise __________ times a day. Exercise D:Internal Rotation  1. Place your left / right hand behind your back, palm-up. 2. Use your other hand to dangle an exercise band, a towel, or a similar object over your shoulder. Grasp the band with   your left / right hand so you are holding onto both ends. 3. Gently pull up on the band until you feel a stretch in the front of your left / right shoulder. ? Avoid shrugging your shoulder while you raise your arm. Keep your shoulder blade tucked down toward the middle of your back. 4. Hold for __________ seconds. 5. Release the stretch by letting go of the band and lowering your hands. Repeat __________ times. Complete  this exercise __________ times a day. STRETCHING EXERCISES These exercises warm up your muscles and joints and improve the movement and flexibility of your shoulder. These exercises also help to relieve pain, numbness, and tingling. These exercises are done using your healthy shoulder to help stretch the muscles of your injured shoulder. Exercise E: Corner Stretch (External Rotation and Abduction)  1. Stand in a doorway with one of your feet slightly in front of the other. This is called a staggered stance. If you cannot reach your forearms to the door frame, stand facing a corner of a room. 2. Choose one of the following positions as told by your health care provider: ? Place your hands and forearms on the door frame above your head. ? Place your hands and forearms on the door frame at the height of your head. ? Place your hands on the door frame at the height of your elbows. 3. Slowly move your weight onto your front foot until you feel a stretch across your chest and in the front of your shoulders. Keep your head and chest upright and keep your abdominal muscles tight. 4. Hold for __________ seconds. 5. To release the stretch, shift your weight to your back foot. Repeat __________ times. Complete this stretch __________ times a day. Exercise F:Extension, Standing 1. Stand and hold a broomstick, a cane, or a similar object behind your back. ? Your hands should be a little wider than shoulder-width apart. ? Your palms should face away from your back. 2. Keeping your elbows straight and keeping your shoulder muscles relaxed, move the stick away from your body until you feel a stretch in your shoulder. ? Avoid shrugging your shoulders while you move the stick. Keep your shoulder blade tucked down toward the middle of your back. 3. Hold for __________ seconds. 4. Slowly return to the starting position. Repeat __________ times. Complete this exercise __________ times a day. STRENGTHENING  EXERCISES These exercises build strength and endurance in your shoulder. Endurance is the ability to use your muscles for a long time, even after they get tired. Exercise G:External Rotation  1. Sit in a stable chair without armrests. 2. Secure an exercise band at elbow height on your left / right side. 3. Place a soft object, such as a folded towel or a small pillow, between your left / right upper arm and your body to move your elbow a few inches away (about 10 cm) from your side. 4. Hold the end of the band so it is tight and there is no slack. 5. Keeping your elbow pressed against the soft object, move your left / right forearm out, away from your abdomen. Keep your body steady so only your forearm moves. 6. Hold for __________ seconds. 7. Slowly return to the starting position. Repeat __________ times. Complete this exercise __________ times a day. Exercise H:Shoulder Abduction  1. Sit in a stable chair without armrests, or stand. 2. Hold a __________ weight in your left / right hand, or hold an exercise band with both hands.   3. Start with your arms straight down and your left / right palm facing in, toward your body. 4. Slowly lift your left / right hand out to your side. Do not lift your hand above shoulder height unless your health care provider tells you that this is safe. ? Keep your arms straight. ? Avoid shrugging your shoulder while you do this movement. Keep your shoulder blade tucked down toward the middle of your back. 5. Hold for __________ seconds. 6. Slowly lower your arm, and return to the starting position. Repeat __________ times. Complete this exercise __________ times a day. Exercise I:Shoulder Extension 1. Sit in a stable chair without armrests, or stand. 2. Secure an exercise band to a stable object in front of you where it is at shoulder height. 3. Hold one end of the exercise band in each hand. Your palms should face each other. 4. Straighten your elbows and  lift your hands up to shoulder height. 5. Step back, away from the secured end of the exercise band, until the band is tight and there is no slack. 6. Squeeze your shoulder blades together as you pull your hands down to the sides of your thighs. Stop when your hands are straight down by your sides. Do not let your hands go behind your body. 7. Hold for __________ seconds. 8. Slowly return to the starting position. Repeat __________ times. Complete this exercise __________ times a day. Exercise J:Standing Shoulder Row 1. Sit in a stable chair without armrests, or stand. 2. Secure an exercise band to a stable object in front of you so it is at waist height. 3. Hold one end of the exercise band in each hand. Your palms should be in a thumbs-up position. 4. Bend each of your elbows to an "L" shape (about 90 degrees) and keep your upper arms at your sides. 5. Step back until the band is tight and there is no slack. 6. Slowly pull your elbows back behind you. 7. Hold for __________ seconds. 8. Slowly return to the starting position. Repeat __________ times. Complete this exercise __________ times a day. Exercise K:Shoulder Press-Ups  1. Sit in a stable chair that has armrests. Sit upright, with your feet flat on the floor. 2. Put your hands on the armrests so your elbows are bent and your fingers are pointing forward. Your hands should be about even with the sides of your body. 3. Push down on the armrests and use your arms to lift yourself off of the chair. Straighten your elbows and lift yourself up as much as you comfortably can. ? Move your shoulder blades down, and avoid letting your shoulders move up toward your ears. ? Keep your feet on the ground. As you get stronger, your feet should support less of your body weight as you lift yourself up. 4. Hold for __________ seconds. 5. Slowly lower yourself back into the chair. Repeat __________ times. Complete this exercise __________ times a  day. Exercise L: Wall Push-Ups  1. Stand so you are facing a stable wall. Your feet should be about one arm-length away from the wall. 2. Lean forward and place your palms on the wall at shoulder height. 3. Keep your feet flat on the floor as you bend your elbows and lean forward toward the wall. 4. Hold for __________ seconds. 5. Straighten your elbows to push yourself back to the starting position. Repeat __________ times. Complete this exercise __________ times a day. This information is not intended to replace advice   given to you by your health care provider. Make sure you discuss any questions you have with your health care provider. Document Released: 05/04/2005 Document Revised: 03/14/2016 Document Reviewed: 03/01/2015 Elsevier Interactive Patient Education  2018 [employer]  

## 2017-12-21 NOTE — Progress Notes (Signed)
BP 125/80 (BP Location: Left Arm, Patient Position: Sitting, Cuff Size: Normal)   Pulse 74   Temp 97.7 F (36.5 C)   Ht 5\' 1"  (1.549 m)   Wt 185 lb (83.9 kg)   SpO2 98%   BMI 34.96 kg/m    Subjective:    Patient ID: Ashley Chase, female    DOB: 01-Jan-1961, 57 y.o.   MRN: [id number]  HPI: Ashley Chase is a 57 y.o. female presenting on 12/21/2017 for Shoulder Pain (R shoulder. began hurting 6 months on and off. this time has hurt for 2 weeks. 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 pt takes IBU for pain which helps a little) and Insect Bite (for 2-3 weeks on L breast. )   HPI  Chief Complaint  Patient presents with  . Shoulder Pain    R shoulder. began hurting 6 months on and off. this time has hurt for 2 weeks. 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 pt takes IBU for pain which helps a little  . Insect Bite    for 2-3 weeks on L breast.    Problems with R shoulder in the past.  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    Pt works as an Engineer, production.    Pt says she switched nsaids- stopped the naproxen and is taking ibu now which helps but she still has difficulty sleeping due to the pain.  She uses ice packs frequently.   Pt has place on her breast that is taking a long time to heal.  She is using neosporin on it.    Relevant past medical, surgical, family and social history reviewed and updated as indicated. Interim medical history since our last visit reviewed. Allergies and medications reviewed and updated.   Current Outpatient Medications:  .  amLODipine (NORVASC) 5 MG tablet, Take 1 tablet (5 mg total) by mouth daily., Disp: 90 tablet, Rfl: 2 .  atenolol (TENORMIN) 25 MG tablet, Take 1 tablet (25 mg total) by mouth daily., Disp: 30 tablet, Rfl: 1 .  atorvastatin (LIPITOR) 10 MG tablet, Take 1 tablet (10 mg total) by mouth daily., Disp: 90 tablet, Rfl: 1 .   ibuprofen (ADVIL,MOTRIN) 200 MG tablet, Take 200 mg by mouth every 6 (six) hours as needed., Disp: , Rfl:  .  Insulin Glargine (LANTUS SOLOSTAR) 100 UNIT/ML Solostar Pen, Inject 15 Units into the skin at bedtime. (Patient taking differently: Inject 20 Units into the skin at bedtime. ), Disp: 5 pen, Rfl: PRN .  lisinopril-hydrochlorothiazide (PRINZIDE,ZESTORETIC) 20-25 MG tablet, Take 1 tablet by mouth daily., Disp: 90 tablet, Rfl: 1 .  metFORMIN (GLUCOPHAGE) 500 MG tablet, Take 1 tablet (500 mg total) by mouth 2 (two) times daily with a meal., Disp: 180 tablet, Rfl: 1 .  Omega-3 Fatty Acids (FISH OIL) 1000 MG CAPS, 1 po qd, Disp: , Rfl: 0 .  sitaGLIPtin (JANUVIA) 100 MG tablet, Take 1 tablet (100 mg total) by mouth daily., Disp: 90 tablet, Rfl: 1 .  naproxen sodium (ALEVE) 220 MG tablet, Take 220 mg by mouth 2 (two) times daily as needed., Disp: , Rfl:   Review of Systems  Constitutional: Negative for appetite change, chills, diaphoresis, fatigue, fever and unexpected weight change.  HENT: Negative for congestion, dental problem, drooling, ear pain, facial swelling, hearing loss, mouth sores, sneezing, sore throat, trouble swallowing and voice change.   Eyes: Negative for pain, discharge,  redness, itching and visual disturbance.  Respiratory: Negative for cough, choking, shortness of breath and wheezing.   Cardiovascular: Negative for chest pain, palpitations and leg swelling.  Gastrointestinal: Negative for abdominal pain, blood in stool, constipation, diarrhea and vomiting.  Endocrine: Negative for cold intolerance, heat intolerance and polydipsia.  Genitourinary: Negative for decreased urine volume, dysuria and hematuria.  Musculoskeletal: Positive for arthralgias. Negative for back pain and gait problem.  Skin: Negative for rash.  Allergic/Immunologic: Negative for environmental allergies.  Neurological: Negative for seizures, syncope, light-headedness and headaches.  Hematological: Negative  for adenopathy.  Psychiatric/Behavioral: Negative for agitation, dysphoric mood and suicidal ideas. The patient is not nervous/anxious.     Per HPI unless specifically indicated above     Objective:    BP 125/80 (BP Location: Left Arm, Patient Position: Sitting, Cuff Size: Normal)   Pulse 74   Temp 97.7 F (36.5 C)   Ht 5\' 1"  (1.549 m)   Wt 185 lb (83.9 kg)   SpO2 98%   BMI 34.96 kg/m   Wt Readings from Last 3 Encounters:  12/21/17 185 lb (83.9 kg)  12/13/17 183 lb 12 oz (83.3 kg)  11/09/17 184 lb 12 oz (83.8 kg)    Physical Exam  Constitutional: She is oriented to person, place, and time. She appears well-developed and well-nourished.  HENT:  Head: Normocephalic and atraumatic.  Pulmonary/Chest: Effort normal. No respiratory distress.  Approximately 1 cm infected wound top portion L breast- scab with small amount purulence oozing out.  Surrounding tissue not red.  Only mildly tender    Musculoskeletal:       Right shoulder: She exhibits decreased range of motion, tenderness and pain. She exhibits no bony tenderness, no swelling, no effusion, no crepitus, no deformity and normal pulse.  Pain with abduction and extension past 90 degrees and on internal rotation  Neurological: She is alert and oriented to person, place, and time.  Skin: Skin is warm and dry.  Psychiatric: She has a normal mood and affect. Her behavior is normal. Thought content normal.  Nursing note and vitals reviewed.       Assessment & Plan:   Encounter Diagnoses  Name Primary?  . Chronic right shoulder pain Yes  . Infected wound     -Discussed xray but pt would like to wait on that for now -rx diclofenac.  Ice. Exercises -rx keflex.  Stop neosportin -Follow up July as scheduled.  RTO sooner prn

## 2018-01-19 ENCOUNTER — Other Ambulatory Visit (HOSPITAL_COMMUNITY)
Admission: RE | Admit: 2018-01-19 | Discharge: 2018-01-19 | Disposition: A | Discharge status: Home / Self Care | Payer: PRIVATE HEALTH INSURANCE | Source: Ambulatory Visit | Attending: Physician Assistant | Admitting: Physician Assistant

## 2018-01-19 DIAGNOSIS — R7989 Other specified abnormal findings of blood chemistry: Secondary | ICD-10-CM | POA: Insufficient documentation

## 2018-01-19 DIAGNOSIS — E1165 Type 2 diabetes mellitus with hyperglycemia: Secondary | ICD-10-CM

## 2018-01-19 DIAGNOSIS — E785 Hyperlipidemia, unspecified: Secondary | ICD-10-CM | POA: Insufficient documentation

## 2018-01-19 DIAGNOSIS — I10 Essential (primary) hypertension: Secondary | ICD-10-CM

## 2018-01-19 LAB — COMPREHENSIVE METABOLIC PANEL
ALBUMIN: 3.9 g/dL (ref 3.5–5.0)
ALT: 26 U/L (ref 0–44)
ANION GAP: 8 (ref 5–15)
AST: 20 U/L (ref 15–41)
Alkaline Phosphatase: 106 U/L (ref 38–126)
BILIRUBIN TOTAL: 0.5 mg/dL (ref 0.3–1.2)
BUN: 17 mg/dL (ref 6–20)
CO2: 30 mmol/L (ref 22–32)
Calcium: 8.8 mg/dL — ABNORMAL LOW (ref 8.9–10.3)
Chloride: 103 mmol/L (ref 98–111)
Creatinine, Ser: 0.64 mg/dL (ref 0.44–1.00)
GFR calc Af Amer: >60 mL/min (ref >60)
GFR calc non Af Amer: >60 mL/min (ref >60)
GLUCOSE: 145 mg/dL — AB (ref 70–99)
Potassium: 3.9 mmol/L (ref 3.5–5.1)
Sodium: 141 mmol/L (ref 135–145)
TOTAL PROTEIN: 7.3 g/dL (ref 6.5–8.1)

## 2018-01-19 LAB — LIPID PANEL
Cholesterol: 178 mg/dL (ref 0–200)
HDL: 38 mg/dL — ABNORMAL LOW (ref >40)
LDL CALC: 107 mg/dL — AB (ref 0–99)
TRIGLYCERIDES: 164 mg/dL — AB (ref <150)
Total CHOL/HDL Ratio: 4.7 RATIO
VLDL: 33 mg/dL (ref 0–40)

## 2018-01-19 LAB — HEMOGLOBIN A1C
HEMOGLOBIN A1C: 9 % — AB (ref 4.8–5.6)
Mean Plasma Glucose: 211.6 mg/dL

## 2018-01-19 LAB — TSH: TSH: 5.182 u[IU]/mL — ABNORMAL HIGH (ref 0.350–4.500)

## 2018-01-24 ENCOUNTER — Ambulatory Visit: Payer: Self-pay | Admitting: Physician Assistant

## 2018-01-24 ENCOUNTER — Encounter: Payer: Self-pay | Admitting: Physician Assistant

## 2018-01-24 VITALS — BP 134/81 | HR 73 | Temp 97.9°F | Ht 61.0 in | Wt 186.0 lb

## 2018-01-24 DIAGNOSIS — E1165 Type 2 diabetes mellitus with hyperglycemia: Secondary | ICD-10-CM

## 2018-01-24 DIAGNOSIS — I10 Essential (primary) hypertension: Secondary | ICD-10-CM

## 2018-01-24 DIAGNOSIS — E785 Hyperlipidemia, unspecified: Secondary | ICD-10-CM

## 2018-01-24 MED ORDER — DICLOFENAC SODIUM 75 MG PO TBEC: 75.0000 mg | DELAYED_RELEASE_TABLET | Freq: Two times a day (BID) | ORAL | 0 refills | Status: DC | PRN

## 2018-01-24 NOTE — Patient Instructions (Signed)
Diabetes Mellitus and Nutrition When you have diabetes (diabetes mellitus), it is very important to have healthy eating habits because your blood sugar (glucose) levels are greatly affected by what you eat and drink. Eating healthy foods in the appropriate amounts, at about the same times every day, can help you:  Control your blood glucose.  Lower your risk of heart disease.  Improve your blood pressure.  Reach or maintain a healthy weight.  Every person with diabetes is different, and each person has different needs for a meal plan. Your health care provider may recommend that you work with a diet and nutrition specialist (dietitian) to make a meal plan that is best for you. Your meal plan may vary depending on factors such as:  The calories you need.  The medicines you take.  Your weight.  Your blood glucose, blood pressure, and cholesterol levels.  Your activity level.  Other health conditions you have, such as heart or kidney disease.  How do carbohydrates affect me? Carbohydrates affect your blood glucose level more than any other type of food. Eating carbohydrates naturally increases the amount of glucose in your blood. Carbohydrate counting is a method for keeping track of how many carbohydrates you eat. Counting carbohydrates is important to keep your blood glucose at a healthy level, especially if you use insulin or take certain oral diabetes medicines. It is important to know how many carbohydrates you can safely have in each meal. This is different for every person. Your dietitian can help you calculate how many carbohydrates you should have at each meal and for snack. Foods that contain carbohydrates include:  Bread, cereal, rice, pasta, and crackers.  Potatoes and corn.  Peas, beans, and lentils.  Milk and yogurt.  Fruit and juice.  Desserts, such as cakes, cookies, ice cream, and candy.  How does alcohol affect me? Alcohol can cause a sudden decrease in blood  glucose (hypoglycemia), especially if you use insulin or take certain oral diabetes medicines. Hypoglycemia can be a life-threatening condition. Symptoms of hypoglycemia (sleepiness, dizziness, and confusion) are similar to symptoms of having too much alcohol. If your health care provider says that alcohol is safe for you, follow these guidelines:  Limit alcohol intake to no more than 1 drink per day for nonpregnant women and 2 drinks per day for men. One drink equals 12 oz of beer, 5 oz of wine, or 1 oz of hard liquor.  Do not drink on an empty stomach.  Keep yourself hydrated with water, diet soda, or unsweetened iced tea.  Keep in mind that regular soda, juice, and other mixers may contain a lot of sugar and must be counted as carbohydrates.  What are tips for following this plan? Reading food labels  Start by checking the serving size on the label. The amount of calories, carbohydrates, fats, and other nutrients listed on the label are based on one serving of the food. Many foods contain more than one serving per package.  Check the total grams (g) of carbohydrates in one serving. You can calculate the number of servings of carbohydrates in one serving by dividing the total carbohydrates by 15. For example, if a food has 30 g of total carbohydrates, it would be equal to 2 servings of carbohydrates.  Check the number of grams (g) of saturated and trans fats in one serving. Choose foods that have low or no amount of these fats.  Check the number of milligrams (mg) of sodium in one serving. Most people   should limit total sodium intake to less than 2,300 mg per day.  Always check the nutrition information of foods labeled as "low-fat" or "nonfat". These foods may be higher in added sugar or refined carbohydrates and should be avoided.  Talk to your dietitian to identify your daily goals for nutrients listed on the label. Shopping  Avoid buying canned, premade, or processed foods. These  foods tend to be high in fat, sodium, and added sugar.  Shop around the outside edge of the grocery store. This includes fresh fruits and vegetables, bulk grains, fresh meats, and fresh dairy. Cooking  Use low-heat cooking methods, such as baking, instead of high-heat cooking methods like deep frying.  Cook using healthy oils, such as olive, canola, or sunflower oil.  Avoid cooking with butter, cream, or high-fat meats. Meal planning  Eat meals and snacks regularly, preferably at the same times every day. Avoid going long periods of time without eating.  Eat foods high in fiber, such as fresh fruits, vegetables, beans, and whole grains. Talk to your dietitian about how many servings of carbohydrates you can eat at each meal.  Eat 4-6 ounces of lean protein each day, such as lean meat, chicken, fish, eggs, or tofu. 1 ounce is equal to 1 ounce of meat, chicken, or fish, 1 egg, or 1/4 cup of tofu.  Eat some foods each day that contain healthy fats, such as avocado, nuts, seeds, and fish. Lifestyle   Check your blood glucose regularly.  Exercise at least 30 minutes 5 or more days each week, or as told by your health care provider.  Take medicines as told by your health care provider.  Do not use any products that contain nicotine or tobacco, such as cigarettes and e-cigarettes. If you need help quitting, ask your health care provider.  Work with a counselor or diabetes educator to identify strategies to manage stress and any emotional and social challenges. What are some questions to ask my health care provider?  Do I need to meet with a diabetes educator?  Do I need to meet with a dietitian?  What number can I call if I have questions?  When are the best times to check my blood glucose? Where to find more information:  American Diabetes Association: diabetes.org/food-and-fitness/food  Academy of Nutrition and Dietetics:  www.eatright.org/resources/health/diseases-and-conditions/diabetes  National Institute of Diabetes and Digestive and Kidney Diseases (NIH): www.niddk.nih.gov/health-information/diabetes/overview/diet-eating-physical-activity Summary  A healthy meal plan will help you control your blood glucose and maintain a healthy lifestyle.  Working with a diet and nutrition specialist (dietitian) can help you make a meal plan that is best for you.  Keep in mind that carbohydrates and alcohol have immediate effects on your blood glucose levels. It is important to count carbohydrates and to use alcohol carefully. This information is not intended to replace advice given to you by your health care provider. Make sure you discuss any questions you have with your health care provider. Document Released: 03/17/2005 Document Revised: 07/25/2016 Document Reviewed: 07/25/2016 Elsevier Interactive Patient Education  2018 [employer]  

## 2018-01-24 NOTE — Progress Notes (Signed)
BP 134/81 (BP Location: Right Arm, Patient Position: Sitting, Cuff Size: Normal)   Pulse 73   Temp 97.9 F (36.6 C)   Ht 5\' 1"  (1.549 m)   Wt 186 lb (84.4 kg)   SpO2 100%   BMI 35.14 kg/m    Subjective:    Patient ID: Ashley Chase, female    DOB: Dec 20, 1960, 57 y.o.   MRN: [id number]  HPI: Ashley Bernhardtlyce Mysliwiec is a 57 y.o. female presenting on 01/24/2018 for Diabetes; Hypertension; and Hyperlipidemia   HPI   Pt did not bring bs log- this am fbs was 149  Pt is feeling well.  Relevant past medical, surgical, family and social history reviewed and updated as indicated. Interim medical history since our last visit reviewed. Allergies and medications reviewed and updated.   Current Outpatient Medications:  .  amLODipine (NORVASC) 5 MG tablet, Take 1 tablet (5 mg total) by mouth daily., Disp: 90 tablet, Rfl: 2 .  atenolol (TENORMIN) 25 MG tablet, Take 1 tablet (25 mg total) by mouth daily., Disp: 30 tablet, Rfl: 1 .  atorvastatin (LIPITOR) 10 MG tablet, Take 1 tablet (10 mg total) by mouth daily., Disp: 90 tablet, Rfl: 1 .  diclofenac (VOLTAREN) 75 MG EC tablet, Take 1 tablet (75 mg total) by mouth 2 (two) times daily as needed., Disp: 30 tablet, Rfl: 0 .  Insulin Glargine (LANTUS SOLOSTAR) 100 UNIT/ML Solostar Pen, Inject 15 Units into the skin at bedtime. (Patient taking differently: Inject 20 Units into the skin at bedtime. ), Disp: 5 pen, Rfl: PRN .  lisinopril-hydrochlorothiazide (PRINZIDE,ZESTORETIC) 20-25 MG tablet, Take 1 tablet by mouth daily., Disp: 90 tablet, Rfl: 1 .  metFORMIN (GLUCOPHAGE) 500 MG tablet, Take 1 tablet (500 mg total) by mouth 2 (two) times daily with a meal., Disp: 180 tablet, Rfl: 1 .  Omega-3 Fatty Acids (FISH OIL) 1000 MG CAPS, 1 po qd, Disp: , Rfl: 0 .  sitaGLIPtin (JANUVIA) 100 MG tablet, Take 1 tablet (100 mg total) by mouth daily., Disp: 90 tablet, Rfl: 1   Review of Systems  Constitutional: Negative for appetite change, chills, diaphoresis, fatigue,  fever and unexpected weight change.  HENT: Negative for congestion, dental problem, drooling, ear pain, facial swelling, hearing loss, mouth sores, sneezing, sore throat, trouble swallowing and voice change.   Eyes: Negative for pain, discharge, redness, itching and visual disturbance.  Respiratory: Negative for cough, choking, shortness of breath and wheezing.   Cardiovascular: Negative for chest pain, palpitations and leg swelling.  Gastrointestinal: Negative for abdominal pain, blood in stool, constipation, diarrhea and vomiting.  Endocrine: Negative for cold intolerance, heat intolerance and polydipsia.  Genitourinary: Negative for decreased urine volume, dysuria and hematuria.  Musculoskeletal: Negative for arthralgias, back pain and gait problem.  Skin: Negative for rash.  Allergic/Immunologic: Negative for environmental allergies.  Neurological: Negative for seizures, syncope, light-headedness and headaches.  Hematological: Negative for adenopathy.  Psychiatric/Behavioral: Negative for agitation, dysphoric mood and suicidal ideas. The patient is not nervous/anxious.     Per HPI unless specifically indicated above     Objective:    BP 134/81 (BP Location: Right Arm, Patient Position: Sitting, Cuff Size: Normal)   Pulse 73   Temp 97.9 F (36.6 C)   Ht 5\' 1"  (1.549 m)   Wt 186 lb (84.4 kg)   SpO2 100%   BMI 35.14 kg/m   Wt Readings from Last 3 Encounters:  01/24/18 186 lb (84.4 kg)  12/21/17 185 lb (83.9 kg)  12/13/17 183 lb 12  oz (83.3 kg)    Physical Exam  Constitutional: She is oriented to person, place, and time. She appears well-developed and well-nourished.  HENT:  Head: Normocephalic and atraumatic.  Neck: Neck supple.  Cardiovascular: Normal rate and regular rhythm.  Pulmonary/Chest: Effort normal and breath sounds normal.  Abdominal: Soft. Bowel sounds are normal. She exhibits no mass. There is no hepatosplenomegaly. There is no tenderness.  Musculoskeletal:  She exhibits no edema.  Lymphadenopathy:    She has no cervical adenopathy.  Neurological: She is alert and oriented to person, place, and time.  Skin: Skin is warm and dry.  Psychiatric: She has a normal mood and affect. Her behavior is normal.  Vitals reviewed.   Results for orders placed or performed during the hospital encounter of 01/19/18  Comprehensive metabolic panel  Result Value Ref Range   Sodium 141 135 - 145 mmol/L   Potassium 3.9 3.5 - 5.1 mmol/L   Chloride 103 98 - 111 mmol/L   CO2 30 22 - 32 mmol/L   Glucose, Bld 145 (H) 70 - 99 mg/dL   BUN 17 6 - 20 mg/dL   Creatinine, Ser 3.24 0.44 - 1.00 mg/dL   Calcium 8.8 (L) 8.9 - 10.3 mg/dL   Total Protein 7.3 6.5 - 8.1 g/dL   Albumin 3.9 3.5 - 5.0 g/dL   AST 20 15 - 41 U/L   ALT 26 0 - 44 U/L   Alkaline Phosphatase 106 38 - 126 U/L   Total Bilirubin 0.5 0.3 - 1.2 mg/dL   GFR calc non Af Amer >60 >60 mL/min   GFR calc Af Amer >60 >60 mL/min   Anion gap 8 5 - 15  TSH  Result Value Ref Range   TSH 5.182 (H) 0.350 - 4.500 uIU/mL  Lipid panel  Result Value Ref Range   Cholesterol 178 0 - 200 mg/dL   Triglycerides 401 (H) <150 mg/dL   HDL 38 (L) >02 mg/dL   Total CHOL/HDL Ratio 4.7 RATIO   VLDL 33 0 - 40 mg/dL   LDL Cholesterol 725 (H) 0 - 99 mg/dL  Hemoglobin D6U  Result Value Ref Range   Hgb A1c MFr Bld 9.0 (H) 4.8 - 5.6 %   Mean Plasma Glucose 211.6 mg/dL      Assessment & Plan:   Encounter Diagnoses  Name Primary?  Marland Kitchen Uncontrolled type 2 diabetes mellitus with hyperglycemia (HCC) Yes  . Essential hypertension   . Hyperlipidemia, unspecified hyperlipidemia type     -reviewed labs with pt -pt to Continue current medications -Increase insulin to 25 units. She is reminded to call office for fbs < 70 or > 300 -counseled pt on diabetic diet and gave reading information.  Encouraged pt to attend DM education class at [facility] -pt is on list for diabetic eye exam -pt will follow up with bs log 6 weeks.  RTO soone  prn

## 2018-03-07 ENCOUNTER — Ambulatory Visit: Payer: Self-pay | Admitting: Physician Assistant

## 2018-03-07 ENCOUNTER — Encounter: Payer: Self-pay | Admitting: Physician Assistant

## 2018-03-07 VITALS — BP 123/78 | HR 84 | Temp 97.9°F | Ht 61.0 in | Wt 187.5 lb

## 2018-03-07 DIAGNOSIS — E1165 Type 2 diabetes mellitus with hyperglycemia: Secondary | ICD-10-CM

## 2018-03-07 DIAGNOSIS — F419 Anxiety disorder, unspecified: Secondary | ICD-10-CM

## 2018-03-07 MED ORDER — CITALOPRAM HYDROBROMIDE 20 MG PO TABS: 20.0000 mg | ORAL_TABLET | Freq: Every day | ORAL | 0 refills | Status: DC

## 2018-03-07 NOTE — Progress Notes (Signed)
BP 123/78 (BP Location: Left Arm, Patient Position: Sitting, Cuff Size: Normal)   Pulse 84   Temp 97.9 F (36.6 C)   Ht 5\' 1"  (1.549 m)   Wt 187 lb 8 oz (85 kg)   SpO2 98%   BMI 35.43 kg/m    Subjective:    Patient ID: Ashley Chase, female    DOB: 27-May-1961, 57 y.o.   MRN: [id number]  HPI: Ashley Chase is a 57 y.o. female presenting on 03/07/2018 for Diabetes   HPI  Pt states anxiety for past month or two.  It is bothering her sleep.  She cries at times.  Denies SI, HI.  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   Pt is only taking the diclofenac three times each week now because her shoulder is improving.   Pt has bs log- range 116-162.  Pt denies lows.  Relevant past medical, surgical, family and social history reviewed and updated as indicated. Interim medical history since our last visit reviewed. Allergies and medications reviewed and updated.   Current Outpatient Medications:  .  amLODipine (NORVASC) 5 MG tablet, Take 1 tablet (5 mg total) by mouth daily., Disp: 90 tablet, Rfl: 2 .  atenolol (TENORMIN) 25 MG tablet, Take 1 tablet (25 mg total) by mouth daily., Disp: 30 tablet, Rfl: 1 .  atorvastatin (LIPITOR) 10 MG tablet, Take 1 tablet (10 mg total) by mouth daily., Disp: 90 tablet, Rfl: 1 .  diclofenac (VOLTAREN) 75 MG EC tablet, Take 1 tablet (75 mg total) by mouth 2 (two) times daily as needed., Disp: 30 tablet, Rfl: 0 .  Insulin Glargine (LANTUS SOLOSTAR) 100 UNIT/ML Solostar Pen, Inject 15 Units into the skin at bedtime. (Patient taking differently: Inject 25 Units into the skin at bedtime. ), Disp: 5 pen, Rfl: PRN .  lisinopril-hydrochlorothiazide (PRINZIDE,ZESTORETIC) 20-25 MG tablet, Take 1 tablet by mouth daily., Disp: 90 tablet, Rfl: 1 .  metFORMIN (GLUCOPHAGE) 500 MG tablet, Take 1 tablet (500 mg total) by mouth 2 (two) times daily with a meal., Disp: 180 tablet, Rfl:  1 .  Omega-3 Fatty Acids (FISH OIL) 1000 MG CAPS, 1 po qd, Disp: , Rfl: 0 .  sitaGLIPtin (JANUVIA) 100 MG tablet, Take 1 tablet (100 mg total) by mouth daily., Disp: 90 tablet, Rfl: 1   Review of Systems  Constitutional: Negative for appetite change, chills, diaphoresis, fatigue, fever and unexpected weight change.  HENT: Negative for congestion, dental problem, drooling, ear pain, facial swelling, hearing loss, mouth sores, sneezing, sore throat, trouble swallowing and voice change.   Eyes: Negative for pain, discharge, redness, itching and visual disturbance.  Respiratory: Negative for cough, choking, shortness of breath and wheezing.   Cardiovascular: Negative for chest pain, palpitations and leg swelling.  Gastrointestinal: Negative for abdominal pain, blood in stool, constipation, diarrhea and vomiting.  Endocrine: Negative for cold intolerance, heat intolerance and polydipsia.  Genitourinary: Negative for decreased urine volume, dysuria and hematuria.  Musculoskeletal: Negative for arthralgias, back pain and gait problem.  Skin: Negative for rash.  Allergic/Immunologic: Negative for environmental allergies.  Neurological: Negative for seizures, syncope, light-headedness and headaches.  Hematological: Negative for adenopathy.  Psychiatric/Behavioral: Negative for agitation, dysphoric mood and suicidal ideas. The patient is nervous/anxious.     Per HPI unless specifically indicated above     Objective:    BP 123/78 (BP Location: Left Arm, Patient Position:  Sitting, Cuff Size: Normal)   Pulse 84   Temp 97.9 F (36.6 C)   Ht 5\' 1"  (1.549 m)   Wt 187 lb 8 oz (85 kg)   SpO2 98%   BMI 35.43 kg/m   Wt Readings from Last 3 Encounters:  03/07/18 187 lb 8 oz (85 kg)  01/24/18 186 lb (84.4 kg)  12/21/17 185 lb (83.9 kg)    Physical Exam  Constitutional: She is oriented to person, place, and time. She appears well-developed and well-nourished.  Pulmonary/Chest: Effort normal. No  respiratory distress.  Neurological: She is alert and oriented to person, place, and time.  Skin: Skin is warm and dry.  Psychiatric: She has a normal mood and affect. Her speech is normal and behavior is normal. Thought content normal.  Tearful at times  Nursing note and vitals reviewed.        Assessment & Plan:    Encounter Diagnoses  Name Primary?  Marland Kitchen Anxiety Yes  . Uncontrolled type 2 diabetes mellitus with hyperglycemia (HCC)     -gave pt contact information for financial counselor at [facility] and gave pt another [employer] application as she is still getting bills for lab tests -rx citalopram for mood -no changes in diabetes medications today.  Pt to monitor bs  -pt to follow up 3 weeks to recheck mood.  RTO sooner prn

## 2018-03-07 NOTE — Patient Instructions (Signed)
Financial counselor- [phone] Ask about [employer] application

## 2018-03-13 ENCOUNTER — Encounter: Payer: Self-pay | Admitting: Physician Assistant

## 2018-03-13 NOTE — Progress Notes (Signed)
Pt came by the office c/o nasal congestion, scratchy throat, some hoarseness, coughing, and HA for the past 3 days. Pt states she has been taking claritin but has not helped much. Pt states no fever, but feels warm as if she has a fever.  Pt was recommended to do warm salt water gargles, take robitussin for cough, and tylenol for HA and if she has fever tylenol should help with that also. Pt verbalized understanding.  Pt was advised to call our office for an appointment if no improvements by the end of the week. Pt verbalized understanding.

## 2018-03-19 ENCOUNTER — Encounter: Payer: Self-pay | Admitting: Physician Assistant

## 2018-03-28 ENCOUNTER — Encounter: Payer: Self-pay | Admitting: Physician Assistant

## 2018-03-28 ENCOUNTER — Ambulatory Visit: Payer: Self-pay | Admitting: Physician Assistant

## 2018-03-28 VITALS — BP 113/72 | HR 100 | Temp 97.7°F | Ht 61.0 in | Wt 186.5 lb

## 2018-03-28 DIAGNOSIS — F419 Anxiety disorder, unspecified: Secondary | ICD-10-CM

## 2018-03-28 DIAGNOSIS — I10 Essential (primary) hypertension: Secondary | ICD-10-CM

## 2018-03-28 DIAGNOSIS — E785 Hyperlipidemia, unspecified: Secondary | ICD-10-CM

## 2018-03-28 DIAGNOSIS — R7989 Other specified abnormal findings of blood chemistry: Secondary | ICD-10-CM

## 2018-03-28 DIAGNOSIS — E1165 Type 2 diabetes mellitus with hyperglycemia: Secondary | ICD-10-CM

## 2018-03-28 MED ORDER — CITALOPRAM HYDROBROMIDE 20 MG PO TABS: 20.0000 mg | ORAL_TABLET | Freq: Every day | ORAL | 1 refills | Status: DC

## 2018-03-28 NOTE — Progress Notes (Signed)
BP 113/72 (BP Location: Right Arm, Patient Position: Sitting, Cuff Size: Normal)   Pulse 100   Temp 97.7 F (36.5 C)   Ht 5\' 1"  (1.549 m)   Wt 186 lb 8 oz (84.6 kg)   SpO2 94%   BMI 35.24 kg/m    Subjective:    Patient ID: Ashley Chase, female    DOB: 10/07/1960, 57 y.o.   MRN: [id number]  HPI: Ashley Chase is a 57 y.o. female presenting on 03/28/2018 for Anxiety   HPI  Pt says she is feeling much better and is sleeping much better.  She says she is still occasionally having episodes of crying, but much less than previously.  No SI, HI.   Relevant past medical, surgical, family and social history reviewed and updated as indicated. Interim medical history since our last visit reviewed. Allergies and medications reviewed and updated.    Current Outpatient Medications:  .  amLODipine (NORVASC) 5 MG tablet, Take 1 tablet (5 mg total) by mouth daily., Disp: 90 tablet, Rfl: 2 .  atenolol (TENORMIN) 25 MG tablet, Take 1 tablet (25 mg total) by mouth daily., Disp: 30 tablet, Rfl: 1 .  atorvastatin (LIPITOR) 10 MG tablet, Take 1 tablet (10 mg total) by mouth daily., Disp: 90 tablet, Rfl: 1 .  citalopram (CELEXA) 20 MG tablet, Take 1 tablet (20 mg total) by mouth daily., Disp: 30 tablet, Rfl: 0 .  diclofenac (VOLTAREN) 75 MG EC tablet, Take 1 tablet (75 mg total) by mouth 2 (two) times daily as needed., Disp: 30 tablet, Rfl: 0 .  Insulin Glargine (LANTUS SOLOSTAR) 100 UNIT/ML Solostar Pen, Inject 15 Units into the skin at bedtime. (Patient taking differently: Inject 25 Units into the skin at bedtime. ), Disp: 5 pen, Rfl: PRN .  lisinopril-hydrochlorothiazide (PRINZIDE,ZESTORETIC) 20-25 MG tablet, Take 1 tablet by mouth daily., Disp: 90 tablet, Rfl: 1 .  metFORMIN (GLUCOPHAGE) 500 MG tablet, Take 1 tablet (500 mg total) by mouth 2 (two) times daily with a meal., Disp: 180 tablet, Rfl: 1 .  sitaGLIPtin (JANUVIA) 100 MG tablet, Take 1 tablet (100 mg total) by mouth daily., Disp: 90 tablet, Rfl:  1 .  Omega-3 Fatty Acids (FISH OIL) 1000 MG CAPS, 1 po qd (Patient not taking: Reported on 03/28/2018), Disp: , Rfl: 0  Review of Systems  Constitutional: Negative for appetite change, chills, diaphoresis, fatigue, fever and unexpected weight change.  HENT: Negative for congestion, dental problem, drooling, ear pain, facial swelling, hearing loss, mouth sores, sneezing, sore throat, trouble swallowing and voice change.   Eyes: Negative for pain, discharge, redness, itching and visual disturbance.  Respiratory: Negative for cough, choking, shortness of breath and wheezing.   Cardiovascular: Negative for chest pain, palpitations and leg swelling.  Gastrointestinal: Negative for abdominal pain, blood in stool, constipation, diarrhea and vomiting.  Endocrine: Negative for cold intolerance, heat intolerance and polydipsia.  Genitourinary: Negative for decreased urine volume, dysuria and hematuria.  Musculoskeletal: Negative for arthralgias, back pain and gait problem.  Skin: Negative for rash.  Allergic/Immunologic: Negative for environmental allergies.  Neurological: Negative for seizures, syncope, light-headedness and headaches.  Hematological: Negative for adenopathy.  Psychiatric/Behavioral: Negative for agitation, dysphoric mood and suicidal ideas. The patient is not nervous/anxious.     Per HPI unless specifically indicated above     Objective:    BP 113/72 (BP Location: Right Arm, Patient Position: Sitting, Cuff Size: Normal)   Pulse 100   Temp 97.7 F (36.5 C)   Ht 5\' 1"  (1.549  m)   Wt 186 lb 8 oz (84.6 kg)   SpO2 94%   BMI 35.24 kg/m   Wt Readings from Last 3 Encounters:  03/28/18 186 lb 8 oz (84.6 kg)  03/07/18 187 lb 8 oz (85 kg)  01/24/18 186 lb (84.4 kg)    Physical Exam  Constitutional: She is oriented to person, place, and time. She appears well-developed and well-nourished.  HENT:  Head: Normocephalic and atraumatic.  Neck: Neck supple.  Cardiovascular: Normal  rate and regular rhythm.  Pulmonary/Chest: Effort normal and breath sounds normal.  Abdominal: Soft. Bowel sounds are normal. She exhibits no mass. There is no hepatosplenomegaly. There is no tenderness.  Musculoskeletal: She exhibits no edema.  Lymphadenopathy:    She has no cervical adenopathy.  Neurological: She is alert and oriented to person, place, and time.  Skin: Skin is warm and dry.  Psychiatric: She has a normal mood and affect. Her behavior is normal.  Vitals reviewed.       Assessment & Plan:   Encounter Diagnoses  Name Primary?  Marland Kitchen Anxiety Yes  . Uncontrolled type 2 diabetes mellitus with hyperglycemia (HCC)   . Essential hypertension   . Hyperlipidemia, unspecified hyperlipidemia type   . Abnormal thyroid blood test      -pt to continue current medications -pt is given card for [facility] in the event she wants to go for counseling -pt to follow up 1 month with recheck labs before appointment

## 2018-04-12 ENCOUNTER — Other Ambulatory Visit: Payer: Self-pay | Admitting: Physician Assistant

## 2018-05-01 ENCOUNTER — Ambulatory Visit: Payer: Self-pay | Admitting: Physician Assistant

## 2018-05-02 ENCOUNTER — Other Ambulatory Visit (HOSPITAL_COMMUNITY)
Admission: RE | Admit: 2018-05-02 | Discharge: 2018-05-02 | Disposition: A | Discharge status: Home / Self Care | Payer: PRIVATE HEALTH INSURANCE | Source: Ambulatory Visit | Attending: Physician Assistant | Admitting: Physician Assistant

## 2018-05-02 DIAGNOSIS — F419 Anxiety disorder, unspecified: Secondary | ICD-10-CM

## 2018-05-02 DIAGNOSIS — E785 Hyperlipidemia, unspecified: Secondary | ICD-10-CM | POA: Insufficient documentation

## 2018-05-02 DIAGNOSIS — E1165 Type 2 diabetes mellitus with hyperglycemia: Secondary | ICD-10-CM

## 2018-05-02 DIAGNOSIS — R7989 Other specified abnormal findings of blood chemistry: Secondary | ICD-10-CM | POA: Insufficient documentation

## 2018-05-02 DIAGNOSIS — I10 Essential (primary) hypertension: Secondary | ICD-10-CM | POA: Insufficient documentation

## 2018-05-02 LAB — LIPID PANEL
CHOL/HDL RATIO: 4.9 ratio
Cholesterol: 166 mg/dL (ref 0–200)
HDL: 34 mg/dL — ABNORMAL LOW (ref >40)
LDL CALC: 101 mg/dL — AB (ref 0–99)
Triglycerides: 157 mg/dL — ABNORMAL HIGH (ref <150)
VLDL: 31 mg/dL (ref 0–40)

## 2018-05-02 LAB — COMPREHENSIVE METABOLIC PANEL
ALBUMIN: 4.1 g/dL (ref 3.5–5.0)
ALT: 20 U/L (ref 0–44)
AST: 20 U/L (ref 15–41)
Alkaline Phosphatase: 123 U/L (ref 38–126)
Anion gap: 8 (ref 5–15)
BUN: 16 mg/dL (ref 6–20)
CHLORIDE: 101 mmol/L (ref 98–111)
CO2: 29 mmol/L (ref 22–32)
Calcium: 9 mg/dL (ref 8.9–10.3)
Creatinine, Ser: 0.71 mg/dL (ref 0.44–1.00)
GFR calc Af Amer: >60 mL/min (ref >60)
GLUCOSE: 153 mg/dL — AB (ref 70–99)
POTASSIUM: 4.4 mmol/L (ref 3.5–5.1)
Sodium: 138 mmol/L (ref 135–145)
TOTAL PROTEIN: 7.6 g/dL (ref 6.5–8.1)
Total Bilirubin: 0.7 mg/dL (ref 0.3–1.2)

## 2018-05-02 LAB — TSH: TSH: 5.563 u[IU]/mL — AB (ref 0.350–4.500)

## 2018-05-02 LAB — HEMOGLOBIN A1C
Hgb A1c MFr Bld: 7.9 % — ABNORMAL HIGH (ref 4.8–5.6)
Mean Plasma Glucose: 180.03 mg/dL

## 2018-05-08 ENCOUNTER — Encounter: Payer: Self-pay | Admitting: Physician Assistant

## 2018-05-08 ENCOUNTER — Ambulatory Visit: Payer: Self-pay | Admitting: Physician Assistant

## 2018-05-08 VITALS — BP 112/72 | HR 89 | Temp 97.7°F | Ht 61.0 in | Wt 188.2 lb

## 2018-05-08 DIAGNOSIS — E039 Hypothyroidism, unspecified: Secondary | ICD-10-CM

## 2018-05-08 DIAGNOSIS — Z5320 Procedure and treatment not carried out because of patient's decision for unspecified reasons: Secondary | ICD-10-CM

## 2018-05-08 DIAGNOSIS — E1165 Type 2 diabetes mellitus with hyperglycemia: Secondary | ICD-10-CM

## 2018-05-08 DIAGNOSIS — E785 Hyperlipidemia, unspecified: Secondary | ICD-10-CM

## 2018-05-08 DIAGNOSIS — F419 Anxiety disorder, unspecified: Secondary | ICD-10-CM

## 2018-05-08 DIAGNOSIS — I10 Essential (primary) hypertension: Secondary | ICD-10-CM

## 2018-05-08 MED ORDER — LEVOTHYROXINE SODIUM 25 MCG PO TABS: 25.0000 ug | ORAL_TABLET | Freq: Every day | ORAL | 1 refills | Status: DC

## 2018-05-08 MED ORDER — INSULIN GLARGINE 100 UNIT/ML SOLOSTAR PEN: 30.0000 [IU] | PEN_INJECTOR | Freq: Every day | SUBCUTANEOUS | 99 refills | Status: DC

## 2018-05-08 MED ORDER — ATORVASTATIN CALCIUM 20 MG PO TABS: 20.0000 mg | ORAL_TABLET | Freq: Every day | ORAL | 1 refills | Status: DC

## 2018-05-08 NOTE — Progress Notes (Signed)
BP 112/72 (BP Location: Right Arm, Patient Position: Sitting, Cuff Size: Normal)   Pulse 89   Temp 97.7 F (36.5 C)   Ht 5\' 1"  (1.549 m)   Wt 188 lb 4 oz (85.4 kg)   SpO2 100%   BMI 35.57 kg/m    Subjective:    Patient ID: Ashley Chase, female    DOB: [birth year], 57 y.o.   MRN: [id number]  HPI: Ashley Chase is a 57 y.o. female presenting on 05/08/2018 for Diabetes; Mental Health Problem; Hyperlipidemia; and Hypertension   HPI Chief Complaint  Patient presents with  . Diabetes  . Mental Health Problem  . Hyperlipidemia  . Hypertension     Pt says mood is much better.  She says she Only occasionally has crying now.  Denies SI, HI.   bs log reviewed - no lows.  Range 170-126  Pt has no complaints today  Relevant past medical, surgical, family and social history reviewed and updated as indicated. Interim medical history since our last visit reviewed. Allergies and medications reviewed and updated.   Current Outpatient Medications:  .  amLODipine (NORVASC) 5 MG tablet, Take 1 tablet (5 mg total) by mouth daily., Disp: 90 tablet, Rfl: 2 .  atenolol (TENORMIN) 25 MG tablet, Take 1 tablet (25 mg total) by mouth daily., Disp: 30 tablet, Rfl: 1 .  atorvastatin (LIPITOR) 10 MG tablet, TAKE 1 Tablet BY MOUTH ONCE DAILY, Disp: 90 tablet, Rfl: 1 .  citalopram (CELEXA) 20 MG tablet, Take 1 tablet (20 mg total) by mouth daily., Disp: 30 tablet, Rfl: 1 .  diclofenac (VOLTAREN) 75 MG EC tablet, Take 1 tablet (75 mg total) by mouth 2 (two) times daily as needed., Disp: 30 tablet, Rfl: 0 .  Insulin Glargine (LANTUS SOLOSTAR) 100 UNIT/ML Solostar Pen, Inject 15 Units into the skin at bedtime. (Patient taking differently: Inject 25 Units into the skin at bedtime. ), Disp: 5 pen, Rfl: PRN .  JANUVIA 100 MG tablet, TAKE 1 Tablet BY MOUTH ONCE DAILY, Disp: 90 tablet, Rfl: 1 .  lisinopril-hydrochlorothiazide (PRINZIDE,ZESTORETIC) 20-25 MG tablet, TAKE 1 Tablet BY MOUTH ONCE DAILY, Disp: 90 tablet,  Rfl: 1 .  metFORMIN (GLUCOPHAGE) 500 MG tablet, TAKE 1 Tablet  BY MOUTH TWICE DAILY WITH MEALS, Disp: 180 tablet, Rfl: 1 .  Omega-3 Fatty Acids (FISH OIL) 1000 MG CAPS, 1 po qd, Disp: , Rfl: 0  Review of Systems  Constitutional: Negative for appetite change, chills, diaphoresis, fatigue, fever and unexpected weight change.  HENT: Negative for congestion, dental problem, drooling, ear pain, facial swelling, hearing loss, mouth sores, sneezing, sore throat, trouble swallowing and voice change.   Eyes: Negative for pain, discharge, redness, itching and visual disturbance.  Respiratory: Negative for cough, choking, shortness of breath and wheezing.   Cardiovascular: Negative for chest pain, palpitations and leg swelling.  Gastrointestinal: Negative for abdominal pain, blood in stool, constipation, diarrhea and vomiting.  Endocrine: Negative for cold intolerance, heat intolerance and polydipsia.  Genitourinary: Negative for decreased urine volume, dysuria and hematuria.  Musculoskeletal: Negative for arthralgias, back pain and gait problem.  Skin: Negative for rash.  Allergic/Immunologic: Negative for environmental allergies.  Neurological: Negative for seizures, syncope, light-headedness and headaches.  Hematological: Negative for adenopathy.  Psychiatric/Behavioral: Negative for agitation, dysphoric mood and suicidal ideas. The patient is not nervous/anxious.     Per HPI unless specifically indicated above     Objective:    BP 112/72 (BP Location: Right Arm, Patient Position: Sitting, Cuff Size: Normal)  Pulse 89   Temp 97.7 F (36.5 C)   Ht 5\' 1"  (1.549 m)   Wt 188 lb 4 oz (85.4 kg)   SpO2 100%   BMI 35.57 kg/m   Wt Readings from Last 3 Encounters:  05/08/18 188 lb 4 oz (85.4 kg)  03/28/18 186 lb 8 oz (84.6 kg)  03/07/18 187 lb 8 oz (85 kg)    Physical Exam  Constitutional: She is oriented to person, place, and time. She appears well-developed and well-nourished.  HENT:   Head: Normocephalic and atraumatic.  Neck: Neck supple.  Cardiovascular: Normal rate and regular rhythm.  Pulmonary/Chest: Effort normal and breath sounds normal.  Abdominal: Soft. Bowel sounds are normal. She exhibits no mass. There is no hepatosplenomegaly. There is no tenderness.  Musculoskeletal: She exhibits no edema.  Lymphadenopathy:    She has no cervical adenopathy.  Neurological: She is alert and oriented to person, place, and time.  Skin: Skin is warm and dry.  Psychiatric: She has a normal mood and affect. Her behavior is normal.  Vitals reviewed.   Results for orders placed or performed during the hospital encounter of 05/02/18  Comprehensive metabolic panel  Result Value Ref Range   Sodium 138 135 - 145 mmol/L   Potassium 4.4 3.5 - 5.1 mmol/L   Chloride 101 98 - 111 mmol/L   CO2 29 22 - 32 mmol/L   Glucose, Bld 153 (H) 70 - 99 mg/dL   BUN 16 6 - 20 mg/dL   Creatinine, Ser 1.61 0.44 - 1.00 mg/dL   Calcium 9.0 8.9 - 09.6 mg/dL   Total Protein 7.6 6.5 - 8.1 g/dL   Albumin 4.1 3.5 - 5.0 g/dL   AST 20 15 - 41 U/L   ALT 20 0 - 44 U/L   Alkaline Phosphatase 123 38 - 126 U/L   Total Bilirubin 0.7 0.3 - 1.2 mg/dL   GFR calc non Af Amer >60 >60 mL/min   GFR calc Af Amer >60 >60 mL/min   Anion gap 8 5 - 15  TSH  Result Value Ref Range   TSH 5.563 (H) 0.350 - 4.500 uIU/mL  Lipid panel  Result Value Ref Range   Cholesterol 166 0 - 200 mg/dL   Triglycerides 045 (H) <150 mg/dL   HDL 34 (L) >40 mg/dL   Total CHOL/HDL Ratio 4.9 RATIO   VLDL 31 0 - 40 mg/dL   LDL Cholesterol 981 (H) 0 - 99 mg/dL  Hemoglobin X9J  Result Value Ref Range   Hgb A1c MFr Bld 7.9 (H) 4.8 - 5.6 %   Mean Plasma Glucose 180.03 mg/dL      Assessment & Plan:   Encounter Diagnoses  Name Primary?  Marland Kitchen Uncontrolled type 2 diabetes mellitus with hyperglycemia (HCC) Yes  . Anxiety   . Hypothyroidism, unspecified type   . Hyperlipidemia, unspecified hyperlipidemia type   . Essential hypertension    . Pap smear of cervix declined     -reviewed labs wiht pt  -Start small dose levothyroxine.  -Increase atorvastatin to 20mg  and continue lowfat diet -will Increase lantus to 30units.  Pt reminded to call office for fbs < 70 or >300 -pt declined PAP.  She says she knows she should get screened but she does not want to -pt to follow up in 3 months.  She is to RTO sooner if she has any issues

## 2018-08-02 ENCOUNTER — Other Ambulatory Visit (HOSPITAL_COMMUNITY)
Admission: RE | Admit: 2018-08-02 | Discharge: 2018-08-02 | Disposition: A | Discharge status: Home / Self Care | Payer: Self-pay | Source: Ambulatory Visit | Attending: Physician Assistant | Admitting: Physician Assistant

## 2018-08-02 DIAGNOSIS — E039 Hypothyroidism, unspecified: Secondary | ICD-10-CM

## 2018-08-02 DIAGNOSIS — E785 Hyperlipidemia, unspecified: Secondary | ICD-10-CM

## 2018-08-02 DIAGNOSIS — I10 Essential (primary) hypertension: Secondary | ICD-10-CM

## 2018-08-02 DIAGNOSIS — F419 Anxiety disorder, unspecified: Secondary | ICD-10-CM

## 2018-08-02 DIAGNOSIS — E1165 Type 2 diabetes mellitus with hyperglycemia: Secondary | ICD-10-CM

## 2018-08-02 LAB — COMPREHENSIVE METABOLIC PANEL
ALBUMIN: 4.1 g/dL (ref 3.5–5.0)
ALT: 17 U/L (ref 0–44)
ANION GAP: 10 (ref 5–15)
AST: 16 U/L (ref 15–41)
Alkaline Phosphatase: 112 U/L (ref 38–126)
BILIRUBIN TOTAL: 0.4 mg/dL (ref 0.3–1.2)
BUN: 16 mg/dL (ref 6–20)
CO2: 29 mmol/L (ref 22–32)
Calcium: 9.3 mg/dL (ref 8.9–10.3)
Chloride: 100 mmol/L (ref 98–111)
Creatinine, Ser: 0.61 mg/dL (ref 0.44–1.00)
GFR calc non Af Amer: >60 mL/min (ref >60)
GLUCOSE: 152 mg/dL — AB (ref 70–99)
POTASSIUM: 4.2 mmol/L (ref 3.5–5.1)
SODIUM: 139 mmol/L (ref 135–145)
TOTAL PROTEIN: 7.7 g/dL (ref 6.5–8.1)

## 2018-08-02 LAB — HEMOGLOBIN A1C
Hgb A1c MFr Bld: 8.1 % — ABNORMAL HIGH (ref 4.8–5.6)
MEAN PLASMA GLUCOSE: 185.77 mg/dL

## 2018-08-02 LAB — LIPID PANEL
CHOL/HDL RATIO: 4.4 ratio
Cholesterol: 170 mg/dL (ref 0–200)
HDL: 39 mg/dL — ABNORMAL LOW (ref >40)
LDL Cholesterol: 99 mg/dL (ref 0–99)
Triglycerides: 160 mg/dL — ABNORMAL HIGH (ref <150)
VLDL: 32 mg/dL (ref 0–40)

## 2018-08-02 LAB — TSH: TSH: 5.79 u[IU]/mL — AB (ref 0.350–4.500)

## 2018-08-08 ENCOUNTER — Ambulatory Visit: Payer: Self-pay | Admitting: Physician Assistant

## 2018-08-08 ENCOUNTER — Encounter: Payer: Self-pay | Admitting: Physician Assistant

## 2018-08-08 VITALS — BP 134/83 | HR 96 | Temp 97.9°F

## 2018-08-08 DIAGNOSIS — E039 Hypothyroidism, unspecified: Secondary | ICD-10-CM

## 2018-08-08 DIAGNOSIS — E1165 Type 2 diabetes mellitus with hyperglycemia: Secondary | ICD-10-CM

## 2018-08-08 DIAGNOSIS — E785 Hyperlipidemia, unspecified: Secondary | ICD-10-CM

## 2018-08-08 DIAGNOSIS — F419 Anxiety disorder, unspecified: Secondary | ICD-10-CM

## 2018-08-08 DIAGNOSIS — I10 Essential (primary) hypertension: Secondary | ICD-10-CM

## 2018-08-08 MED ORDER — CITALOPRAM HYDROBROMIDE 20 MG PO TABS: 20.0000 mg | ORAL_TABLET | Freq: Every day | ORAL | 3 refills | Status: DC

## 2018-08-08 MED ORDER — ATORVASTATIN CALCIUM 20 MG PO TABS: 20.0000 mg | ORAL_TABLET | Freq: Every day | ORAL | 1 refills | Status: DC

## 2018-08-08 MED ORDER — AMLODIPINE BESYLATE 5 MG PO TABS: 5.0000 mg | ORAL_TABLET | Freq: Every day | ORAL | 2 refills | Status: DC

## 2018-08-08 MED ORDER — LEVOTHYROXINE SODIUM 25 MCG PO TABS: 25.0000 ug | ORAL_TABLET | Freq: Every day | ORAL | 1 refills | Status: DC

## 2018-08-08 MED ORDER — SITAGLIPTIN PHOSPHATE 100 MG PO TABS: 100.0000 mg | ORAL_TABLET | Freq: Every day | ORAL | 1 refills | Status: DC

## 2018-08-08 MED ORDER — METFORMIN HCL 500 MG PO TABS: 500.0000 mg | ORAL_TABLET | Freq: Two times a day (BID) | ORAL | 1 refills | Status: DC

## 2018-08-08 MED ORDER — DICLOFENAC SODIUM 75 MG PO TBEC: 75.0000 mg | DELAYED_RELEASE_TABLET | Freq: Two times a day (BID) | ORAL | 0 refills | Status: DC | PRN

## 2018-08-08 MED ORDER — ATENOLOL 25 MG PO TABS: 25.0000 mg | ORAL_TABLET | Freq: Every day | ORAL | 3 refills | Status: DC

## 2018-08-08 MED ORDER — LISINOPRIL-HYDROCHLOROTHIAZIDE 20-25 MG PO TABS: 1.0000 | ORAL_TABLET | Freq: Every day | ORAL | 1 refills | Status: DC

## 2018-08-08 NOTE — Progress Notes (Signed)
BP 134/83   Pulse 96   Temp 97.9 F (36.6 C)   SpO2 100%    Subjective:    Patient ID: Ashley Chase, female    DOB: 01-Apr-1961, 58 y.o.   MRN: [id number]  HPI: Ashley Chase is a 58 y.o. female presenting on 08/08/2018 for Diabetes; Hypertension; Hyperlipidemia; Hypothyroidism; and Mental Health Problem   HPI  Pt has been out of her citalopram about a month ago.  She doesn't know how she feels off it.  She started getting light-headed after she stopped it.   She has been checking her bs- was 139 yesterday.  She didn't bring her bs log with her today.  She is working 2 different care-giving jobs now.   Pt says she hasn't been eating properly lately.   Relevant past medical, surgical, family and social history reviewed and updated as indicated. Interim medical history since our last visit reviewed. Allergies and medications reviewed and updated.   Current Outpatient Medications:  .  amLODipine (NORVASC) 5 MG tablet, Take 1 tablet (5 mg total) by mouth daily., Disp: 90 tablet, Rfl: 2 .  atenolol (TENORMIN) 25 MG tablet, Take 1 tablet (25 mg total) by mouth daily., Disp: 30 tablet, Rfl: 1 .  atorvastatin (LIPITOR) 20 MG tablet, Take 1 tablet (20 mg total) by mouth daily., Disp: 90 tablet, Rfl: 1 .  diclofenac (VOLTAREN) 75 MG EC tablet, Take 1 tablet (75 mg total) by mouth 2 (two) times daily as needed., Disp: 30 tablet, Rfl: 0 .  Insulin Glargine (LANTUS SOLOSTAR) 100 UNIT/ML Solostar Pen, Inject 30 Units into the skin at bedtime., Disp: 5 pen, Rfl: PRN .  JANUVIA 100 MG tablet, TAKE 1 Tablet BY MOUTH ONCE DAILY, Disp: 90 tablet, Rfl: 1 .  levothyroxine (SYNTHROID, LEVOTHROID) 25 MCG tablet, Take 1 tablet (25 mcg total) by mouth daily before breakfast., Disp: 90 tablet, Rfl: 1 .  lisinopril-hydrochlorothiazide (PRINZIDE,ZESTORETIC) 20-25 MG tablet, TAKE 1 Tablet BY MOUTH ONCE DAILY, Disp: 90 tablet, Rfl: 1 .  metFORMIN (GLUCOPHAGE) 500 MG tablet, TAKE 1 Tablet  BY MOUTH TWICE DAILY  WITH MEALS, Disp: 180 tablet, Rfl: 1 .  Omega-3 Fatty Acids (FISH OIL) 1000 MG CAPS, 1 po qd, Disp: , Rfl: 0 .  citalopram (CELEXA) 20 MG tablet, Take 1 tablet (20 mg total) by mouth daily. (Patient not taking: Reported on 08/08/2018), Disp: 30 tablet, Rfl: 1    Review of Systems  Constitutional: Negative for appetite change, chills, diaphoresis, fatigue, fever and unexpected weight change.  HENT: Negative for congestion, dental problem, drooling, ear pain, facial swelling, hearing loss, mouth sores, sneezing, sore throat, trouble swallowing and voice change.   Eyes: Negative for pain, discharge, redness, itching and visual disturbance.  Respiratory: Negative for cough, choking, shortness of breath and wheezing.   Cardiovascular: Negative for chest pain, palpitations and leg swelling.  Gastrointestinal: Negative for abdominal pain, blood in stool, constipation, diarrhea and vomiting.  Endocrine: Negative for cold intolerance, heat intolerance and polydipsia.  Genitourinary: Negative for decreased urine volume, dysuria and hematuria.  Musculoskeletal: Negative for arthralgias, back pain and gait problem.  Skin: Negative for rash.  Allergic/Immunologic: Negative for environmental allergies.  Neurological: Negative for seizures, syncope, light-headedness and headaches.  Hematological: Negative for adenopathy.  Psychiatric/Behavioral: Negative for agitation, dysphoric mood and suicidal ideas. The patient is nervous/anxious.     Per HPI unless specifically indicated above     Objective:    BP 134/83   Pulse 96   Temp 97.9 F (  36.6 C)   SpO2 100%   Wt Readings from Last 3 Encounters:  05/08/18 188 lb 4 oz (85.4 kg)  03/28/18 186 lb 8 oz (84.6 kg)  03/07/18 187 lb 8 oz (85 kg)    Physical Exam Vitals signs reviewed.  Constitutional:      Appearance: She is well-developed.  HENT:     Head: Normocephalic and atraumatic.  Neck:     Musculoskeletal: Neck supple.  Cardiovascular:      Rate and Rhythm: Normal rate and regular rhythm.  Pulmonary:     Effort: Pulmonary effort is normal.     Breath sounds: Normal breath sounds.  Abdominal:     General: Bowel sounds are normal.     Palpations: Abdomen is soft. There is no mass.     Tenderness: There is no abdominal tenderness.  Lymphadenopathy:     Cervical: No cervical adenopathy.  Skin:    General: Skin is warm and dry.  Neurological:     Mental Status: She is alert and oriented to person, place, and time.  Psychiatric:        Behavior: Behavior normal.     Results for orders placed or performed during the hospital encounter of 08/02/18  Lipid panel  Result Value Ref Range   Cholesterol 170 0 - 200 mg/dL   Triglycerides 883 (H) <150 mg/dL   HDL 39 (L) >25 mg/dL   Total CHOL/HDL Ratio 4.4 RATIO   VLDL 32 0 - 40 mg/dL   LDL Cholesterol 99 0 - 99 mg/dL  Comprehensive metabolic panel  Result Value Ref Range   Sodium 139 135 - 145 mmol/L   Potassium 4.2 3.5 - 5.1 mmol/L   Chloride 100 98 - 111 mmol/L   CO2 29 22 - 32 mmol/L   Glucose, Bld 152 (H) 70 - 99 mg/dL   BUN 16 6 - 20 mg/dL   Creatinine, Ser 4.98 0.44 - 1.00 mg/dL   Calcium 9.3 8.9 - 26.4 mg/dL   Total Protein 7.7 6.5 - 8.1 g/dL   Albumin 4.1 3.5 - 5.0 g/dL   AST 16 15 - 41 U/L   ALT 17 0 - 44 U/L   Alkaline Phosphatase 112 38 - 126 U/L   Total Bilirubin 0.4 0.3 - 1.2 mg/dL   GFR calc non Af Amer >60 >60 mL/min   GFR calc Af Amer >60 >60 mL/min   Anion gap 10 5 - 15  Hemoglobin A1c  Result Value Ref Range   Hgb A1c MFr Bld 8.1 (H) 4.8 - 5.6 %   Mean Plasma Glucose 185.77 mg/dL  TSH  Result Value Ref Range   TSH 5.790 (H) 0.350 - 4.500 uIU/mL      Assessment & Plan:    Encounter Diagnoses  Name Primary?  Marland Kitchen Uncontrolled type 2 diabetes mellitus with hyperglycemia (HCC) Yes  . Anxiety   . Hypothyroidism, unspecified type   . Hyperlipidemia, unspecified hyperlipidemia type   . Essential hypertension     -reviewed labs with pt -Will  restart citalopram -increase lantus to 35 u.  pt to monitor blood sugars.  She is reminded to call office for fbs < 70 or > 300 -refilled diclofenac per pt request- she says she only uses it about 3 times/ week Pt to follow up in 3 months.  RTO sooner prn

## 2018-08-08 NOTE — Patient Instructions (Signed)
Diabetes Mellitus and Nutrition, Adult When you have diabetes (diabetes mellitus), it is very important to have healthy eating habits because your blood sugar (glucose) levels are greatly affected by what you eat and drink. Eating healthy foods in the appropriate amounts, at about the same times every day, can help you:  Control your blood glucose.  Lower your risk of heart disease.  Improve your blood pressure.  Reach or maintain a healthy weight. Every person with diabetes is different, and each person has different needs for a meal plan. Your health care provider may recommend that you work with a diet and nutrition specialist (dietitian) to make a meal plan that is best for you. Your meal plan may vary depending on factors such as:  The calories you need.  The medicines you take.  Your weight.  Your blood glucose, blood pressure, and cholesterol levels.  Your activity level.  Other health conditions you have, such as heart or kidney disease. How do carbohydrates affect me? Carbohydrates, also called carbs, affect your blood glucose level more than any other type of food. Eating carbs naturally raises the amount of glucose in your blood. Carb counting is a method for keeping track of how many carbs you eat. Counting carbs is important to keep your blood glucose at a healthy level, especially if you use insulin or take certain oral diabetes medicines. It is important to know how many carbs you can safely have in each meal. This is different for every person. Your dietitian can help you calculate how many carbs you should have at each meal and for each snack. Foods that contain carbs include:  Bread, cereal, rice, pasta, and crackers.  Potatoes and corn.  Peas, beans, and lentils.  Milk and yogurt.  Fruit and juice.  Desserts, such as cakes, cookies, ice cream, and candy. How does alcohol affect me? Alcohol can cause a sudden decrease in blood glucose (hypoglycemia),  especially if you use insulin or take certain oral diabetes medicines. Hypoglycemia can be a life-threatening condition. Symptoms of hypoglycemia (sleepiness, dizziness, and confusion) are similar to symptoms of having too much alcohol. If your health care provider says that alcohol is safe for you, follow these guidelines:  Limit alcohol intake to no more than 1 drink per day for nonpregnant women and 2 drinks per day for men. One drink equals 12 oz of beer, 5 oz of wine, or 1 oz of hard liquor.  Do not drink on an empty stomach.  Keep yourself hydrated with water, diet soda, or unsweetened iced tea.  Keep in mind that regular soda, juice, and other mixers may contain a lot of sugar and must be counted as carbs. What are tips for following this plan?  Reading food labels  Start by checking the serving size on the "Nutrition Facts" label of packaged foods and drinks. The amount of calories, carbs, fats, and other nutrients listed on the label is based on one serving of the item. Many items contain more than one serving per package.  Check the total grams (g) of carbs in one serving. You can calculate the number of servings of carbs in one serving by dividing the total carbs by 15. For example, if a food has 30 g of total carbs, it would be equal to 2 servings of carbs.  Check the number of grams (g) of saturated and trans fats in one serving. Choose foods that have low or no amount of these fats.  Check the number of   milligrams (mg) of salt (sodium) in one serving. Most people should limit total sodium intake to less than 2,300 mg per day.  Always check the nutrition information of foods labeled as "low-fat" or "nonfat". These foods may be higher in added sugar or refined carbs and should be avoided.  Talk to your dietitian to identify your daily goals for nutrients listed on the label. Shopping  Avoid buying canned, premade, or processed foods. These foods tend to be high in fat, sodium,  and added sugar.  Shop around the outside edge of the grocery store. This includes fresh fruits and vegetables, bulk grains, fresh meats, and fresh dairy. Cooking  Use low-heat cooking methods, such as baking, instead of high-heat cooking methods like deep frying.  Cook using healthy oils, such as olive, canola, or sunflower oil.  Avoid cooking with butter, cream, or high-fat meats. Meal planning  Eat meals and snacks regularly, preferably at the same times every day. Avoid going long periods of time without eating.  Eat foods high in fiber, such as fresh fruits, vegetables, beans, and whole grains. Talk to your dietitian about how many servings of carbs you can eat at each meal.  Eat 4-6 ounces (oz) of lean protein each day, such as lean meat, chicken, fish, eggs, or tofu. One oz of lean protein is equal to: ? 1 oz of meat, chicken, or fish. ? 1 egg. ?  cup of tofu.  Eat some foods each day that contain healthy fats, such as avocado, nuts, seeds, and fish. Lifestyle  Check your blood glucose regularly.  Exercise regularly as told by your health care provider. This may include: ? 150 minutes of moderate-intensity or vigorous-intensity exercise each week. This could be brisk walking, biking, or water aerobics. ? Stretching and doing strength exercises, such as yoga or weightlifting, at least 2 times a week.  Take medicines as told by your health care provider.  Do not use any products that contain nicotine or tobacco, such as cigarettes and e-cigarettes. If you need help quitting, ask your health care provider.  Work with a Veterinary surgeoncounselor or diabetes educator to identify strategies to manage stress and any emotional and social challenges. Questions to ask a health care provider  Do I need to meet with a diabetes educator?  Do I need to meet with a dietitian?  What number can I call if I have questions?  When are the best times to check my blood glucose? Where to find more  information:  American Diabetes Association: diabetes.org  Academy of Nutrition and Dietetics: www.eatright.[employer]  National Institute of Diabetes and Digestive and Kidney Diseases (NIH): Car[employer].tnwww.niddk.nih.gov Summary  A healthy meal plan will help you control your blood glucose and maintain a healthy lifestyle.  Working with a diet and nutrition specialist (dietitian) can help you make a meal plan that is best for you.  Keep in mind that carbohydrates (carbs) and alcohol have immediate effects on your blood glucose levels. It is important to count carbs and to use alcohol carefully. This information is not intended to replace advice given to you by your health care provider. Make sure you discuss any questions you have with your health care provider. Document Released: 03/17/2005 Document Revised: 01/18/2017 Document Reviewed: 07/25/2016 Elsevier Interactive Patient Education  2019 [employer]   Fat and Cholesterol Restricted Eating Plan Eating a diet that limits fat and cholesterol may help lower your risk for heart disease and other conditions. Your body needs fat and cholesterol for basic  functions, but eating too much of these things can be harmful to your health. Your health care provider may order lab tests to check your blood fat (lipid) and cholesterol levels. This helps your health care provider understand your risk for certain conditions and whether you need to make diet changes. Work with your health care provider or dietitian to make an eating plan that is right for you. Your plan includes:  Limit your fat intake to ______% or less of your total calories a day.  Limit your saturated fat intake to ______% or less of your total calories a day.  Limit the amount of cholesterol in your diet to less than _________mg a day.  Eat ___________ g of fiber a day. What are tips for following this plan? General guidelines   If you are overweight, work with your health care provider to  lose weight safely. Losing just 5-10% of your body weight can improve your overall health and help prevent diseases such as diabetes and heart disease.  Avoid: ? Foods with added sugar. ? Fried foods. ? Foods that contain partially hydrogenated oils, including stick margarine, some tub margarines, cookies, crackers, and other baked goods.  Limit alcohol intake to no more than 1 drink a day for nonpregnant women and 2 drinks a day for men. One drink equals 12 oz of beer, 5 oz of wine, or 1 oz of hard liquor. Reading food labels  Check food labels for: ? Trans fats, partially hydrogenated oils, or high amounts of saturated fat. Avoid foods that contain saturated fat and trans fat. ? The amount of cholesterol in each serving. Try to eat no more than 200 mg of cholesterol each day. ? The amount of fiber in each serving. Try to eat at least 20-30 g of fiber each day.  Choose foods with healthy fats, such as: ? Monounsaturated and polyunsaturated fats. These include olive and canola oil, flaxseeds, walnuts, almonds, and seeds. ? Omega-3 fats. These are found in foods such as salmon, mackerel, sardines, tuna, flaxseed oil, and ground flaxseeds.  Choose grain products that have whole grains. Look for the word "whole" as the first word in the ingredient list. Cooking  Cook foods using methods other than frying. Baking, boiling, grilling, and broiling are some healthy options.  Eat more home-cooked food and less restaurant, buffet, and fast food.  Avoid cooking using saturated fats. ? Animal sources of saturated fats include meats, butter, and cream. ? Plant sources of saturated fats include palm oil, palm kernel oil, and coconut oil. Meal planning   At meals, imagine dividing your plate into fourths: ? Fill one-half of your plate with vegetables and green salads. ? Fill one-fourth of your plate with whole grains. ? Fill one-fourth of your plate with lean protein foods.  Eat fish that is  high in omega-3 fats at least two times a week.  Eat more foods that contain fiber, such as whole grains, beans, apples, broccoli, carrots, peas, and barley. These foods help promote healthy cholesterol levels in the blood. Recommended foods Grains  Whole grains, such as whole wheat or whole grain breads, crackers, cereals, and pasta. Unsweetened oatmeal, bulgur, barley, quinoa, or brown rice. Corn or whole wheat flour tortillas. Vegetables  Fresh or frozen vegetables (raw, steamed, roasted, or grilled). Green salads. Fruits  All fresh, canned (in natural juice), or frozen fruits. Meats and other protein foods  Ground beef (85% or leaner), grass-fed beef, or beef trimmed of fat. Skinless chicken or [place].  Ground chicken or [place]. Pork trimmed of fat. All fish and seafood. Egg whites. Dried beans, peas, or lentils. Unsalted nuts or seeds. Unsalted canned beans. Natural nut butters without added sugar and oil. Dairy  Low-fat or nonfat dairy products, such as skim or 1% milk, 2% or reduced-fat cheeses, low-fat and fat-free ricotta or cottage cheese, or plain low-fat and nonfat yogurt. Fats and oils  Tub margarine without trans fats. Light or reduced-fat mayonnaise and salad dressings. Avocado. Olive, canola, sesame, or safflower oils. The items listed above may not be a complete list of recommended foods or beverages. Contact your dietitian for more options. Foods to avoid Grains  White bread. White pasta. White rice. Cornbread. Bagels, pastries, and croissants. Crackers and snack foods that contain trans fat and hydrogenated oils. Vegetables  Vegetables cooked in cheese, cream, or butter sauce. Fried vegetables. Fruits  Canned fruit in heavy syrup. Fruit in cream or butter sauce. Fried fruit. Meats and other protein foods  Fatty cuts of meat. Ribs, chicken wings, bacon, sausage, bologna, salami, chitterlings, fatback, hot dogs, bratwurst, and packaged lunch meats. Liver and organ  meats. Whole eggs and egg yolks. Chicken and [place] with skin. Fried meat. Dairy  Whole or 2% milk, cream, half-and-half, and cream cheese. Whole milk cheeses. Whole-fat or sweetened yogurt. Full-fat cheeses. Nondairy creamers and whipped toppings. Processed cheese, cheese spreads, and cheese curds. Beverages  Alcohol. Sugar-sweetened drinks such as sodas, lemonade, and fruit drinks. Fats and oils  Butter, stick margarine, lard, shortening, ghee, or bacon fat. Coconut, palm kernel, and palm oils. Sweets and desserts  Corn syrup, sugars, honey, and molasses. Candy. Jam and jelly. Syrup. Sweetened cereals. Cookies, pies, cakes, donuts, muffins, and ice cream. The items listed above may not be a complete list of foods and beverages to avoid. Contact your dietitian for more information. Summary  Your body needs fat and cholesterol for basic functions. However, eating too much of these things can be harmful to your health.  Work with your health care provider and dietitian to follow a diet low in fat and cholesterol. Doing this may help lower your risk for heart disease and other conditions.  Choose healthy fats, such as monounsaturated and polyunsaturated fats, and foods high in omega-3 fatty acids.  Eat fiber-rich foods, such as whole grains, beans, peas, fruits, and vegetables.  Limit or avoid alcohol, fried foods, and foods high in saturated fats, partially hydrogenated oils, and sugar. This information is not intended to replace advice given to you by your health care provider. Make sure you discuss any questions you have with your health care provider. Document Released: 06/20/2005 Document Revised: 03/07/2017 Document Reviewed: 03/07/2017 Elsevier Interactive Patient Education  2019 [employer].

## 2018-11-06 ENCOUNTER — Ambulatory Visit: Payer: Self-pay | Admitting: Physician Assistant

## 2018-11-06 ENCOUNTER — Encounter: Payer: Self-pay | Admitting: Physician Assistant

## 2018-11-06 DIAGNOSIS — E039 Hypothyroidism, unspecified: Secondary | ICD-10-CM

## 2018-11-06 DIAGNOSIS — E785 Hyperlipidemia, unspecified: Secondary | ICD-10-CM

## 2018-11-06 DIAGNOSIS — E118 Type 2 diabetes mellitus with unspecified complications: Secondary | ICD-10-CM

## 2018-11-06 DIAGNOSIS — G8929 Other chronic pain: Secondary | ICD-10-CM

## 2018-11-06 DIAGNOSIS — E669 Obesity, unspecified: Secondary | ICD-10-CM

## 2018-11-06 DIAGNOSIS — I10 Essential (primary) hypertension: Secondary | ICD-10-CM

## 2018-11-06 DIAGNOSIS — M25511 Pain in right shoulder: Secondary | ICD-10-CM

## 2018-11-06 NOTE — Progress Notes (Signed)
There were no vitals taken for this visit.   Subjective:    Patient ID: Ashley BernhardtAlyce Chase, female    DOB: 06-Jan-1961, 58 y.o.   MRN: [id number]  HPI: Ashley Chase is a 58 y.o. female presenting on 11/06/2018 for No chief complaint on file.   HPI   This is a telemedicine visit through [employer] due to coronavirus pandemic  I connected with  Doneen Sagan on 11/06/2018 by a video enabled telemedicine application and verified that I am speaking with the correct person using two identifiers.   I discussed the limitations of evaluation and management by telemedicine. The patient expressed understanding and agreed to proceed.    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   She says she sometime has R hand pain and numbess at times. She doesn't know whether it's diabetic neuropathy or related to her chronic R shoulder pain.    She is having some allergies with some sneeziing but is feeling well  Denies fever, cough.   The citalopram is working really well for her mood.  She is sleeping better and is more relaxed.  She is not having any SI, HI  She is monitoring her bs-  highest 135.  Lowest 110  Relevant past medical, surgical, family and social history reviewed and updated as indicated. Interim medical history since our last visit reviewed. Allergies and medications reviewed and updated.   Current Outpatient Medications:  .  amLODipine (NORVASC) 5 MG tablet, Take 1 tablet (5 mg total) by mouth daily., Disp: 90 tablet, Rfl: 2 .  atenolol (TENORMIN) 25 MG tablet, Take 1 tablet (25 mg total) by mouth daily., Disp: 30 tablet, Rfl: 3 .  atorvastatin (LIPITOR) 20 MG tablet, Take 1 tablet (20 mg total) by mouth daily., Disp: 90 tablet, Rfl: 1 .  citalopram (CELEXA) 20 MG tablet, Take 1 tablet (20 mg total) by mouth daily., Disp: 30  tablet, Rfl: 3 .  diclofenac (VOLTAREN) 75 MG EC tablet, Take 1 tablet (75 mg total) by mouth 2 (two) times daily as needed., Disp: 30 tablet, Rfl: 0 .  Insulin Glargine (LANTUS SOLOSTAR) 100 UNIT/ML Solostar Pen, Inject 30 Units into the skin at bedtime., Disp: 5 pen, Rfl: PRN .  levothyroxine (SYNTHROID, LEVOTHROID) 25 MCG tablet, Take 1 tablet (25 mcg total) by mouth daily before breakfast., Disp: 90 tablet, Rfl: 1 .  lisinopril-hydrochlorothiazide (PRINZIDE,ZESTORETIC) 20-25 MG tablet, Take 1 tablet by mouth daily., Disp: 90 tablet, Rfl: 1 .  metFORMIN (GLUCOPHAGE) 500 MG tablet, Take 1 tablet (500 mg total) by mouth 2 (two) times daily with a meal., Disp: 180 tablet, Rfl: 1 .  Omega-3 Fatty Acids (FISH OIL) 1000 MG CAPS, 1 po qd, Disp: , Rfl: 0 .  sitaGLIPtin (JANUVIA) 100 MG tablet, Take 1 tablet (100 mg total) by mouth daily., Disp: 90 tablet, Rfl: 1   Review of Systems  Per HPI unless specifically indicated above     Objective:    There were no vitals taken for this visit.  Wt Readings from Last 3 Encounters:  05/08/18 188 lb 4 oz (85.4 kg)  03/28/18 186 lb 8 oz (84.6 kg)  03/07/18 187 lb 8 oz (85 kg)    Physical Exam Constitutional:      General: She is not  in acute distress.    Appearance: She is obese. She is not ill-appearing.  HENT:     Head: Normocephalic and atraumatic.  Pulmonary:     Effort: Pulmonary effort is normal. No respiratory distress.  Neurological:     Mental Status: She is alert and oriented to person, place, and time.  Psychiatric:        Attention and Perception: Attention normal.        Speech: Speech normal.        Behavior: Behavior normal. Behavior is cooperative.          Assessment & Plan:    Encounter Diagnoses  Name Primary?  . Controlled diabetes mellitus type 2 with complications, unspecified whether long term insulin use (HCC) Yes  . Essential hypertension   . Hyperlipidemia, unspecified hyperlipidemia type   .  Hypothyroidism, unspecified type   . Obesity, unspecified classification, unspecified obesity type, unspecified whether serious comorbidity present   . Chronic right shoulder pain     -Pt was offered but declined gabapentin for her R hand symptoms -pt to Continue current rx.  She is to continue monitoring her bs -will defer routine labs at this time due to CV19 -pt to follow up in 3 months.  Pt to contact office sooner if needed

## 2019-02-01 ENCOUNTER — Other Ambulatory Visit (HOSPITAL_COMMUNITY)
Admission: RE | Admit: 2019-02-01 | Discharge: 2019-02-01 | Disposition: A | Discharge status: Home / Self Care | Payer: Self-pay | Source: Ambulatory Visit | Attending: Physician Assistant | Admitting: Physician Assistant

## 2019-02-01 ENCOUNTER — Other Ambulatory Visit: Payer: Self-pay

## 2019-02-01 DIAGNOSIS — F419 Anxiety disorder, unspecified: Secondary | ICD-10-CM | POA: Insufficient documentation

## 2019-02-01 DIAGNOSIS — E785 Hyperlipidemia, unspecified: Secondary | ICD-10-CM | POA: Insufficient documentation

## 2019-02-01 DIAGNOSIS — I10 Essential (primary) hypertension: Secondary | ICD-10-CM | POA: Insufficient documentation

## 2019-02-01 DIAGNOSIS — E1165 Type 2 diabetes mellitus with hyperglycemia: Secondary | ICD-10-CM | POA: Insufficient documentation

## 2019-02-01 DIAGNOSIS — E039 Hypothyroidism, unspecified: Secondary | ICD-10-CM | POA: Insufficient documentation

## 2019-02-01 LAB — COMPREHENSIVE METABOLIC PANEL
ALT: 19 U/L (ref 0–44)
AST: 18 U/L (ref 15–41)
Albumin: 4.3 g/dL (ref 3.5–5.0)
Alkaline Phosphatase: 101 U/L (ref 38–126)
Anion gap: 9 (ref 5–15)
BUN: 17 mg/dL (ref 6–20)
CO2: 29 mmol/L (ref 22–32)
Calcium: 9.2 mg/dL (ref 8.9–10.3)
Chloride: 102 mmol/L (ref 98–111)
Creatinine, Ser: 0.55 mg/dL (ref 0.44–1.00)
GFR calc Af Amer: >60 mL/min (ref >60)
GFR calc non Af Amer: >60 mL/min (ref >60)
Glucose, Bld: 141 mg/dL — ABNORMAL HIGH (ref 70–99)
Potassium: 4 mmol/L (ref 3.5–5.1)
Sodium: 140 mmol/L (ref 135–145)
Total Bilirubin: 0.5 mg/dL (ref 0.3–1.2)
Total Protein: 7.4 g/dL (ref 6.5–8.1)

## 2019-02-01 LAB — LIPID PANEL
Cholesterol: 170 mg/dL (ref 0–200)
HDL: 38 mg/dL — ABNORMAL LOW (ref >40)
LDL Cholesterol: 97 mg/dL (ref 0–99)
Total CHOL/HDL Ratio: 4.5 RATIO
Triglycerides: 177 mg/dL — ABNORMAL HIGH (ref <150)
VLDL: 35 mg/dL (ref 0–40)

## 2019-02-01 LAB — HEMOGLOBIN A1C
Hgb A1c MFr Bld: 7.5 % — ABNORMAL HIGH (ref 4.8–5.6)
Mean Plasma Glucose: 168.55 mg/dL

## 2019-02-01 LAB — TSH: TSH: 2.175 u[IU]/mL (ref 0.350–4.500)

## 2019-02-02 LAB — MICROALBUMIN, URINE: Microalb, Ur: 5.4 ug/mL — ABNORMAL HIGH

## 2019-02-05 ENCOUNTER — Ambulatory Visit: Payer: Self-pay | Admitting: Physician Assistant

## 2019-02-05 ENCOUNTER — Encounter: Payer: Self-pay | Admitting: Physician Assistant

## 2019-02-05 VITALS — BP 126/78 | HR 90 | Temp 97.2°F | Wt 192.4 lb

## 2019-02-05 DIAGNOSIS — Z1211 Encounter for screening for malignant neoplasm of colon: Secondary | ICD-10-CM

## 2019-02-05 DIAGNOSIS — Z1239 Encounter for other screening for malignant neoplasm of breast: Secondary | ICD-10-CM

## 2019-02-05 DIAGNOSIS — E1165 Type 2 diabetes mellitus with hyperglycemia: Secondary | ICD-10-CM

## 2019-02-05 DIAGNOSIS — E785 Hyperlipidemia, unspecified: Secondary | ICD-10-CM

## 2019-02-05 DIAGNOSIS — E039 Hypothyroidism, unspecified: Secondary | ICD-10-CM

## 2019-02-05 DIAGNOSIS — I10 Essential (primary) hypertension: Secondary | ICD-10-CM

## 2019-02-05 DIAGNOSIS — E669 Obesity, unspecified: Secondary | ICD-10-CM

## 2019-02-05 MED ORDER — LANTUS SOLOSTAR 100 UNIT/ML ~~LOC~~ SOPN: 37.0000 [IU] | PEN_INJECTOR | Freq: Every day | SUBCUTANEOUS | 99 refills | Status: DC

## 2019-02-05 NOTE — Progress Notes (Signed)
BP 126/78   Pulse 90   Temp (!) 97.2 F (36.2 C)   Wt 192 lb 6.4 oz (87.3 kg)   SpO2 97%   BMI 36.35 kg/m    Subjective:    Patient ID: Ashley BernhardtAlyce Gruenberg, female    DOB: 06-29-1961, 58 y.o.   MRN: [id number]  HPI: Ashley Chase is a 58 y.o. female presenting on 02/05/2019 for Diabetes, Hypertension, Hyperlipidemia, Hypothyroidism, and Mental Health Problem   HPI   Pt had a negative covid 19 screening questionnaire    She is continuing to work.  She is a caregiver.  She says her mood is good.  She is having No problems with anxiety or depression.    She is currently using 35u lantus.  She has been monitoring her bs at home.  Lowest fs in past month was 106  She stopped taking her amlodipine because she said her bp was going low.   She has no complaints today.     Relevant past medical, surgical, family and social history reviewed and updated as indicated. Interim medical history since our last visit reviewed. Allergies and medications reviewed and updated.   Current Outpatient Medications:  .  atenolol (TENORMIN) 25 MG tablet, Take 1 tablet (25 mg total) by mouth daily., Disp: 30 tablet, Rfl: 3 .  atorvastatin (LIPITOR) 20 MG tablet, Take 1 tablet (20 mg total) by mouth daily., Disp: 90 tablet, Rfl: 1 .  citalopram (CELEXA) 20 MG tablet, Take 1 tablet (20 mg total) by mouth daily., Disp: 30 tablet, Rfl: 3 .  diclofenac (VOLTAREN) 75 MG EC tablet, Take 1 tablet (75 mg total) by mouth 2 (two) times daily as needed., Disp: 30 tablet, Rfl: 0 .  Insulin Glargine (LANTUS SOLOSTAR) 100 UNIT/ML Solostar Pen, Inject 30 Units into the skin at bedtime. (Patient taking differently: Inject 35 Units into the skin at bedtime. ), Disp: 5 pen, Rfl: PRN .  levothyroxine (SYNTHROID, LEVOTHROID) 25 MCG tablet, Take 1 tablet (25 mcg total) by mouth daily before breakfast., Disp: 90 tablet, Rfl: 1 .  lisinopril-hydrochlorothiazide (PRINZIDE,ZESTORETIC) 20-25 MG tablet, Take 1 tablet by mouth daily.,  Disp: 90 tablet, Rfl: 1 .  metFORMIN (GLUCOPHAGE) 500 MG tablet, Take 1 tablet (500 mg total) by mouth 2 (two) times daily with a meal., Disp: 180 tablet, Rfl: 1 .  sitaGLIPtin (JANUVIA) 100 MG tablet, Take 1 tablet (100 mg total) by mouth daily., Disp: 90 tablet, Rfl: 1 .  amLODipine (NORVASC) 5 MG tablet, Take 1 tablet (5 mg total) by mouth daily. (Patient not taking: Reported on 02/05/2019), Disp: 90 tablet, Rfl: 2 .  Omega-3 Fatty Acids (FISH OIL) 1000 MG CAPS, 1 po qd (Patient not taking: Reported on 02/05/2019), Disp: , Rfl: 0    Review of Systems  Per HPI unless specifically indicated above     Objective:    BP 126/78   Pulse 90   Temp (!) 97.2 F (36.2 C)   Wt 192 lb 6.4 oz (87.3 kg)   SpO2 97%   BMI 36.35 kg/m   Wt Readings from Last 3 Encounters:  02/05/19 192 lb 6.4 oz (87.3 kg)  05/08/18 188 lb 4 oz (85.4 kg)  03/28/18 186 lb 8 oz (84.6 kg)    Physical Exam Vitals signs reviewed.  Constitutional:      General: She is not in acute distress.    Appearance: She is well-developed. She is obese. She is not ill-appearing.  HENT:     Head: Normocephalic and  atraumatic.  Neck:     Musculoskeletal: Neck supple.  Cardiovascular:     Rate and Rhythm: Normal rate and regular rhythm.  Pulmonary:     Effort: Pulmonary effort is normal. No respiratory distress.     Breath sounds: Normal breath sounds.  Abdominal:     General: Bowel sounds are normal.     Palpations: Abdomen is soft. There is no mass.     Tenderness: There is no abdominal tenderness.  Musculoskeletal:     Right lower leg: No edema.     Left lower leg: No edema.  Lymphadenopathy:     Cervical: No cervical adenopathy.  Skin:    General: Skin is warm and dry.  Neurological:     Mental Status: She is alert and oriented to person, place, and time.  Psychiatric:        Attention and Perception: Attention normal.        Speech: Speech normal.        Behavior: Behavior normal. Behavior is cooperative.      Results for orders placed or performed during the hospital encounter of 02/01/19  Microalbumin, urine  Result Value Ref Range   Microalb, Ur 5.4 (H) Not Estab. ug/mL  TSH  Result Value Ref Range   TSH 2.175 0.350 - 4.500 uIU/mL  Lipid panel  Result Value Ref Range   Cholesterol 170 0 - 200 mg/dL   Triglycerides 177 (H) <150 mg/dL   HDL 38 (L) >40 mg/dL   Total CHOL/HDL Ratio 4.5 RATIO   VLDL 35 0 - 40 mg/dL   LDL Cholesterol 97 0 - 99 mg/dL  Comprehensive metabolic panel  Result Value Ref Range   Sodium 140 135 - 145 mmol/L   Potassium 4.0 3.5 - 5.1 mmol/L   Chloride 102 98 - 111 mmol/L   CO2 29 22 - 32 mmol/L   Glucose, Bld 141 (H) 70 - 99 mg/dL   BUN 17 6 - 20 mg/dL   Creatinine, Ser 0.55 0.44 - 1.00 mg/dL   Calcium 9.2 8.9 - 10.3 mg/dL   Total Protein 7.4 6.5 - 8.1 g/dL   Albumin 4.3 3.5 - 5.0 g/dL   AST 18 15 - 41 U/L   ALT 19 0 - 44 U/L   Alkaline Phosphatase 101 38 - 126 U/L   Total Bilirubin 0.5 0.3 - 1.2 mg/dL   GFR calc non Af Amer >60 >60 mL/min   GFR calc Af Amer >60 >60 mL/min   Anion gap 9 5 - 15  Hemoglobin A1c  Result Value Ref Range   Hgb A1c MFr Bld 7.5 (H) 4.8 - 5.6 %   Mean Plasma Glucose 168.55 mg/dL      Assessment & Plan:   Encounter Diagnoses  Name Primary?  Marland Kitchen Uncontrolled type 2 diabetes mellitus with hyperglycemia ([place]) Yes  . Essential hypertension   . Hyperlipidemia, unspecified hyperlipidemia type   . Hypothyroidism, unspecified type   . Obesity, unspecified classification, unspecified obesity type, unspecified whether serious comorbidity present   . Screening for breast cancer   . Screening for colon cancer      -reviewed labs with pt -will Increase lantus to 37u  Qhs. She is to continue to monitor her fbs.  She is to call office for fbs < 70 or > 300 -will refer for screening Mammogram -pt is given ifobt for colon cancer screening -she will be referred for annual DM eye exam -Will stay off the amlodipine/discontinue it.   She is  to continue to monitor her bp and notify office if it is up -pt to follow up 3 months.  She is to contact office sooner prn

## 2019-02-20 ENCOUNTER — Other Ambulatory Visit: Payer: Self-pay | Admitting: Physician Assistant

## 2019-02-20 DIAGNOSIS — Z1239 Encounter for other screening for malignant neoplasm of breast: Secondary | ICD-10-CM

## 2019-02-22 ENCOUNTER — Other Ambulatory Visit: Payer: Self-pay | Admitting: Physician Assistant

## 2019-03-04 ENCOUNTER — Ambulatory Visit (HOSPITAL_COMMUNITY): Payer: Self-pay

## 2019-03-13 ENCOUNTER — Other Ambulatory Visit: Payer: Self-pay

## 2019-03-13 ENCOUNTER — Encounter (HOSPITAL_COMMUNITY): Payer: Self-pay

## 2019-03-13 ENCOUNTER — Ambulatory Visit (HOSPITAL_COMMUNITY)
Admission: RE | Admit: 2019-03-13 | Discharge: 2019-03-13 | Disposition: A | Discharge status: Home / Self Care | Payer: Self-pay | Source: Ambulatory Visit | Attending: Physician Assistant | Admitting: Physician Assistant

## 2019-03-13 DIAGNOSIS — Z1239 Encounter for other screening for malignant neoplasm of breast: Secondary | ICD-10-CM | POA: Insufficient documentation

## 2019-03-18 ENCOUNTER — Other Ambulatory Visit: Payer: Self-pay | Admitting: Physician Assistant

## 2019-05-03 ENCOUNTER — Other Ambulatory Visit (HOSPITAL_COMMUNITY)
Admission: RE | Admit: 2019-05-03 | Discharge: 2019-05-03 | Disposition: A | Discharge status: Home / Self Care | Payer: Self-pay | Source: Ambulatory Visit | Attending: Physician Assistant | Admitting: Physician Assistant

## 2019-05-03 DIAGNOSIS — E785 Hyperlipidemia, unspecified: Secondary | ICD-10-CM | POA: Insufficient documentation

## 2019-05-03 DIAGNOSIS — E1165 Type 2 diabetes mellitus with hyperglycemia: Secondary | ICD-10-CM | POA: Insufficient documentation

## 2019-05-03 DIAGNOSIS — I10 Essential (primary) hypertension: Secondary | ICD-10-CM | POA: Insufficient documentation

## 2019-05-03 LAB — COMPREHENSIVE METABOLIC PANEL
ALT: 16 U/L (ref 0–44)
AST: 17 U/L (ref 15–41)
Albumin: 4 g/dL (ref 3.5–5.0)
Alkaline Phosphatase: 112 U/L (ref 38–126)
Anion gap: 10 (ref 5–15)
BUN: 18 mg/dL (ref 6–20)
CO2: 26 mmol/L (ref 22–32)
Calcium: 9 mg/dL (ref 8.9–10.3)
Chloride: 101 mmol/L (ref 98–111)
Creatinine, Ser: 0.56 mg/dL (ref 0.44–1.00)
GFR calc Af Amer: >60 mL/min (ref >60)
GFR calc non Af Amer: >60 mL/min (ref >60)
Glucose, Bld: 144 mg/dL — ABNORMAL HIGH (ref 70–99)
Potassium: 4.1 mmol/L (ref 3.5–5.1)
Sodium: 137 mmol/L (ref 135–145)
Total Bilirubin: 0.6 mg/dL (ref 0.3–1.2)
Total Protein: 7.2 g/dL (ref 6.5–8.1)

## 2019-05-03 LAB — LIPID PANEL
Cholesterol: 173 mg/dL (ref 0–200)
HDL: 38 mg/dL — ABNORMAL LOW (ref >40)
LDL Cholesterol: 103 mg/dL — ABNORMAL HIGH (ref 0–99)
Total CHOL/HDL Ratio: 4.6 RATIO
Triglycerides: 161 mg/dL — ABNORMAL HIGH (ref <150)
VLDL: 32 mg/dL (ref 0–40)

## 2019-05-03 LAB — HEMOGLOBIN A1C
Hgb A1c MFr Bld: 8.4 % — ABNORMAL HIGH (ref 4.8–5.6)
Mean Plasma Glucose: 194.38 mg/dL

## 2019-05-08 ENCOUNTER — Encounter: Payer: Self-pay | Admitting: Physician Assistant

## 2019-05-08 ENCOUNTER — Ambulatory Visit: Payer: Self-pay | Admitting: Physician Assistant

## 2019-05-08 VITALS — BP 135/76

## 2019-05-08 DIAGNOSIS — I10 Essential (primary) hypertension: Secondary | ICD-10-CM

## 2019-05-08 DIAGNOSIS — E039 Hypothyroidism, unspecified: Secondary | ICD-10-CM

## 2019-05-08 DIAGNOSIS — F419 Anxiety disorder, unspecified: Secondary | ICD-10-CM

## 2019-05-08 DIAGNOSIS — E785 Hyperlipidemia, unspecified: Secondary | ICD-10-CM

## 2019-05-08 DIAGNOSIS — E1165 Type 2 diabetes mellitus with hyperglycemia: Secondary | ICD-10-CM

## 2019-05-08 MED ORDER — LANTUS SOLOSTAR 100 UNIT/ML ~~LOC~~ SOPN: 40.0000 [IU] | PEN_INJECTOR | Freq: Every day | SUBCUTANEOUS | 99 refills | Status: DC

## 2019-05-08 NOTE — Progress Notes (Signed)
BP 135/76    Subjective:    Patient ID: Ashley Chase, female    DOB: [birth year], 58 y.o.   MRN: [id number]  HPI: Ashley Chase is a 58 y.o. female presenting on 05/08/2019 for No chief complaint on file.   HPI  This is a telemedicine appointment through [facility] due to coronavirus pandemic.    I connected with  Ashley Chase on 05/08/19 by a video enabled telemedicine application and verified that I am speaking with the correct person using two identifiers.   I discussed the limitations of evaluation and management by telemedicine. The patient expressed understanding and agreed to proceed.  Pt is at work.  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     Pt says she is doing Overall okay  She is monitoring her blood pressure usually twice/week.  She says it is usually in the 130s.     She is monitoring her blood sugars.  She says the lowest was 120.  She says it is usually in the 160s.  She says she is eating more lately, largely comfort eating.    She says her mood is mostly okay.  She says she has had a few episodes of anxiety but they aren't as bad as they used to be.      Relevant past medical, surgical, family and social history reviewed and updated as indicated. Interim medical history since our last visit reviewed. Allergies and medications reviewed and updated.   Current Outpatient Medications:  .  atenolol (TENORMIN) 25 MG tablet, Take 1 tablet (25 mg total) by mouth daily., Disp: 30 tablet, Rfl: 3 .  atorvastatin (LIPITOR) 20 MG tablet, TAKE 1 Tablet BY MOUTH ONCE DAILY, Disp: 90 tablet, Rfl: 1 .  citalopram (CELEXA) 20 MG tablet, Take 1 tablet by mouth once daily, Disp: 30 tablet, Rfl: 1 .  diclofenac (VOLTAREN) 75 MG EC tablet, Take 1 tablet (75 mg total) by mouth 2 (two) times daily as needed., Disp: 30 tablet, Rfl: 0 .  Insulin Glargine (LANTUS SOLOSTAR) 100 UNIT/ML Solostar Pen, Inject 37 Units into the skin at bedtime., Disp: 5 pen, Rfl: PRN .  JANUVIA 100 MG tablet, TAKE  1 Tablet BY MOUTH ONCE DAILY, Disp: 90 tablet, Rfl: 1 .  lisinopril-hydrochlorothiazide (ZESTORETIC) 20-25 MG tablet, TAKE 1 Tablet BY MOUTH ONCE DAILY, Disp: 90 tablet, Rfl: 1 .  metFORMIN (GLUCOPHAGE) 500 MG tablet, TAKE 1 Tablet  BY MOUTH TWICE DAILY WITH A MEAL, Disp: 180 tablet, Rfl: 1 .  Omega-3 Fatty Acids (FISH OIL) 1000 MG CAPS, 1 po qd, Disp: , Rfl: 0 .  SYNTHROID 25 MCG tablet, TAKE 1 Tablet BY MOUTH ONCE DAILY BEFORE BREAKFAST, Disp: 90 tablet, Rfl: 1    Review of Systems  Per HPI unless specifically indicated above     Objective:    BP 135/76   Wt Readings from Last 3 Encounters:  02/05/19 192 lb 6.4 oz (87.3 kg)  05/08/18 188 lb 4 oz (85.4 kg)  03/28/18 186 lb 8 oz (84.6 kg)    Physical Exam Constitutional:      General: She is not in acute distress.    Appearance: Normal appearance. She is not ill-appearing.  HENT:     Head: Normocephalic and atraumatic.  Pulmonary:     Effort: Pulmonary effort is normal. No respiratory distress.  Neurological:     Mental Status: She is alert and oriented to person, place, and time.  Psychiatric:        Attention and Perception:  Attention normal.        Mood and Affect: Mood normal.        Speech: Speech normal.        Behavior: Behavior is cooperative.     Results for orders placed or performed during the hospital encounter of 05/03/19  Lipid panel  Result Value Ref Range   Cholesterol 173 0 - 200 mg/dL   Triglycerides 161 (H) <150 mg/dL   HDL 38 (L) >40 mg/dL   Total CHOL/HDL Ratio 4.6 RATIO   VLDL 32 0 - 40 mg/dL   LDL Cholesterol 103 (H) 0 - 99 mg/dL  Hemoglobin A1c  Result Value Ref Range   Hgb A1c MFr Bld 8.4 (H) 4.8 - 5.6 %   Mean Plasma Glucose 194.38 mg/dL  Comprehensive metabolic panel  Result Value Ref Range   Sodium 137 135 - 145 mmol/L   Potassium 4.1 3.5 - 5.1 mmol/L   Chloride 101 98 - 111 mmol/L   CO2 26 22 - 32 mmol/L   Glucose, Bld 144 (H) 70 - 99 mg/dL   BUN 18 6 - 20 mg/dL   Creatinine,  Ser 0.56 0.44 - 1.00 mg/dL   Calcium 9.0 8.9 - 10.3 mg/dL   Total Protein 7.2 6.5 - 8.1 g/dL   Albumin 4.0 3.5 - 5.0 g/dL   AST 17 15 - 41 U/L   ALT 16 0 - 44 U/L   Alkaline Phosphatase 112 38 - 126 U/L   Total Bilirubin 0.6 0.3 - 1.2 mg/dL   GFR calc non Af Amer >60 >60 mL/min   GFR calc Af Amer >60 >60 mL/min   Anion gap 10 5 - 15      Assessment & Plan:    1. Uncontrolled diabetes  Reviewed labs with pt  Will increase lantus to 40 units daily and continue other glycemic agents.  She is to continue to monitor the blood sugar.  She is to notify office if she has lows less than 90.  Counseled to watch diabetic diet.  2. Hypertension  Pt to continue current atenolol and lisinopril/hctz.  She is to continue to monitor her BP.  She is instructed to notify office if BP starts running > 412 systolic  3. Dyslipidemia  Pt to continue atorvastatin and lowfat diet  4 hypothyroidism  Pt to continue current synthroid  5. Anxiety  Pt to continue current citalopram.   6. Healthcare maintenance  Pt has appointment for influenza immunization on Monday 05/13/19  Pt says she will return ifobt test for colon cancer screening  Pt is on list for annual DM eye exam.   Mammogram is up to date    Pt to follow up in 3 months.  She is to contact office sooner prn

## 2019-05-17 ENCOUNTER — Other Ambulatory Visit: Payer: Self-pay | Admitting: Physician Assistant

## 2019-05-19 ENCOUNTER — Other Ambulatory Visit: Payer: Self-pay | Admitting: Physician Assistant

## 2019-08-09 ENCOUNTER — Other Ambulatory Visit (HOSPITAL_COMMUNITY)
Admission: RE | Admit: 2019-08-09 | Discharge: 2019-08-09 | Disposition: A | Discharge status: Home / Self Care | Payer: Self-pay | Source: Ambulatory Visit | Attending: Physician Assistant | Admitting: Physician Assistant

## 2019-08-09 ENCOUNTER — Other Ambulatory Visit: Payer: Self-pay

## 2019-08-09 DIAGNOSIS — I10 Essential (primary) hypertension: Secondary | ICD-10-CM | POA: Insufficient documentation

## 2019-08-09 DIAGNOSIS — E1165 Type 2 diabetes mellitus with hyperglycemia: Secondary | ICD-10-CM | POA: Insufficient documentation

## 2019-08-09 DIAGNOSIS — E785 Hyperlipidemia, unspecified: Secondary | ICD-10-CM | POA: Insufficient documentation

## 2019-08-09 DIAGNOSIS — E039 Hypothyroidism, unspecified: Secondary | ICD-10-CM | POA: Insufficient documentation

## 2019-08-09 LAB — COMPREHENSIVE METABOLIC PANEL
ALT: 18 U/L (ref 0–44)
AST: 16 U/L (ref 15–41)
Albumin: 4.1 g/dL (ref 3.5–5.0)
Alkaline Phosphatase: 115 U/L (ref 38–126)
Anion gap: 10 (ref 5–15)
BUN: 15 mg/dL (ref 6–20)
CO2: 29 mmol/L (ref 22–32)
Calcium: 9.1 mg/dL (ref 8.9–10.3)
Chloride: 102 mmol/L (ref 98–111)
Creatinine, Ser: 0.54 mg/dL (ref 0.44–1.00)
GFR calc Af Amer: >60 mL/min (ref >60)
GFR calc non Af Amer: >60 mL/min (ref >60)
Glucose, Bld: 129 mg/dL — ABNORMAL HIGH (ref 70–99)
Potassium: 4 mmol/L (ref 3.5–5.1)
Sodium: 141 mmol/L (ref 135–145)
Total Bilirubin: 0.6 mg/dL (ref 0.3–1.2)
Total Protein: 7.1 g/dL (ref 6.5–8.1)

## 2019-08-09 LAB — LIPID PANEL
Cholesterol: 172 mg/dL (ref 0–200)
HDL: 36 mg/dL — ABNORMAL LOW (ref >40)
LDL Cholesterol: 107 mg/dL — ABNORMAL HIGH (ref 0–99)
Total CHOL/HDL Ratio: 4.8 RATIO
Triglycerides: 143 mg/dL (ref <150)
VLDL: 29 mg/dL (ref 0–40)

## 2019-08-09 LAB — HEMOGLOBIN A1C
Hgb A1c MFr Bld: 8.5 % — ABNORMAL HIGH (ref 4.8–5.6)
Mean Plasma Glucose: 197.25 mg/dL

## 2019-08-09 LAB — TSH: TSH: 3.169 u[IU]/mL (ref 0.350–4.500)

## 2019-08-13 ENCOUNTER — Ambulatory Visit: Payer: Self-pay | Admitting: Physician Assistant

## 2019-08-13 ENCOUNTER — Encounter: Payer: Self-pay | Admitting: Physician Assistant

## 2019-08-13 DIAGNOSIS — I10 Essential (primary) hypertension: Secondary | ICD-10-CM

## 2019-08-13 DIAGNOSIS — E669 Obesity, unspecified: Secondary | ICD-10-CM

## 2019-08-13 DIAGNOSIS — E785 Hyperlipidemia, unspecified: Secondary | ICD-10-CM

## 2019-08-13 DIAGNOSIS — E1165 Type 2 diabetes mellitus with hyperglycemia: Secondary | ICD-10-CM

## 2019-08-13 DIAGNOSIS — E039 Hypothyroidism, unspecified: Secondary | ICD-10-CM

## 2019-08-13 DIAGNOSIS — F419 Anxiety disorder, unspecified: Secondary | ICD-10-CM

## 2019-08-13 MED ORDER — ATORVASTATIN CALCIUM 20 MG PO TABS: 20.0000 mg | ORAL_TABLET | Freq: Every day | ORAL | 1 refills | Status: DC

## 2019-08-13 MED ORDER — CITALOPRAM HYDROBROMIDE 40 MG PO TABS: 40.0000 mg | ORAL_TABLET | Freq: Every day | ORAL | 3 refills | Status: DC

## 2019-08-13 MED ORDER — LEVOTHYROXINE SODIUM 25 MCG PO TABS: ORAL_TABLET | ORAL | 1 refills | Status: DC

## 2019-08-13 MED ORDER — LISINOPRIL-HYDROCHLOROTHIAZIDE 20-25 MG PO TABS: 1.0000 | ORAL_TABLET | Freq: Every day | ORAL | 1 refills | Status: DC

## 2019-08-13 MED ORDER — ATENOLOL 25 MG PO TABS: 25.0000 mg | ORAL_TABLET | Freq: Every day | ORAL | 3 refills | Status: DC

## 2019-08-13 MED ORDER — SITAGLIPTIN PHOSPHATE 100 MG PO TABS: 100.0000 mg | ORAL_TABLET | Freq: Every day | ORAL | 1 refills | Status: DC

## 2019-08-13 MED ORDER — METFORMIN HCL 500 MG PO TABS: ORAL_TABLET | ORAL | 1 refills | Status: DC

## 2019-08-13 NOTE — Progress Notes (Signed)
There were no vitals taken for this visit.   Subjective:    Patient ID: Ashley Chase, female    DOB: 05-03-1961, 59 y.o.   MRN: [id number]  HPI: Ashley Chase is a 59 y.o. female presenting on 08/13/2019 for No chief complaint on file.   HPI This is a telemedicine appointment through [facility] due to coronavirus pandemic.  I connected with  Ashley Chase on 08/13/19 by a video enabled telemedicine application and verified that I am speaking with the correct person using two identifiers.   I discussed the limitations of evaluation and management by telemedicine. The patient expressed understanding and agreed to proceed.   Pt is at work.  provier is at office.    Pt is 58yoF with DM, htn, dyslipidemia, hypothyroidsm and anxiety.  She says she hasn't checked her bp in about a week and the last time she checked it it was around 140/80.  she doesn't have machine with her as she is at work.  She has been out of fish oil > 1 month  She is feeling well.  She relates  A litle more tired but under more stress recently.  She says her mood is okay but thinks and increase in the citalopram might help.  She denies depression, SI, HI.  She says she is just anxious.  She works PT as a Engineer, structural.      Relevant past medical, surgical, family and social history reviewed and updated as indicated. Interim medical history since our last visit reviewed. Allergies and medications reviewed and updated.   Current Outpatient Medications:  .  atenolol (TENORMIN) 25 MG tablet, Take 1 tablet (25 mg total) by mouth daily., Disp: 30 tablet, Rfl: 3 .  atorvastatin (LIPITOR) 20 MG tablet, TAKE 1 Tablet BY MOUTH ONCE DAILY, Disp: 90 tablet, Rfl: 1 .  citalopram (CELEXA) 20 MG tablet, Take 1 tablet by mouth once daily, Disp: 30 tablet, Rfl: 4 .  diclofenac (VOLTAREN) 75 MG EC tablet, Take 1 tablet (75 mg total) by mouth 2 (two) times daily as needed., Disp: 30 tablet, Rfl: 0 .  Insulin Glargine (LANTUS SOLOSTAR) 100  UNIT/ML Solostar Pen, Inject 40 Units into the skin at bedtime., Disp: 5 pen, Rfl: PRN .  JANUVIA 100 MG tablet, TAKE 1 Tablet BY MOUTH ONCE DAILY, Disp: 90 tablet, Rfl: 1 .  lisinopril-hydrochlorothiazide (ZESTORETIC) 20-25 MG tablet, TAKE 1 Tablet BY MOUTH ONCE DAILY, Disp: 90 tablet, Rfl: 1 .  metFORMIN (GLUCOPHAGE) 500 MG tablet, TAKE 1 Tablet  BY MOUTH TWICE DAILY WITH A MEAL, Disp: 180 tablet, Rfl: 1 .  SYNTHROID 25 MCG tablet, TAKE 1 Tablet BY MOUTH ONCE DAILY BEFORE BREAKFAST, Disp: 90 tablet, Rfl: 1 .  Omega-3 Fatty Acids (FISH OIL) 1000 MG CAPS, 1 po qd (Patient not taking: Reported on 08/13/2019), Disp: , Rfl: 0    Review of Systems  Per HPI unless specifically indicated above     Objective:    There were no vitals taken for this visit.  Wt Readings from Last 3 Encounters:  02/05/19 192 lb 6.4 oz (87.3 kg)  05/08/18 188 lb 4 oz (85.4 kg)  03/28/18 186 lb 8 oz (84.6 kg)    Physical Exam Constitutional:      General: She is not in acute distress.    Appearance: Normal appearance. She is obese. She is not ill-appearing.  HENT:     Head: Normocephalic and atraumatic.  Pulmonary:     Effort: Pulmonary effort is normal. No respiratory  distress.  Neurological:     Mental Status: She is alert and oriented to person, place, and time.  Psychiatric:        Attention and Perception: Attention normal.        Mood and Affect: Mood normal.        Speech: Speech normal.        Behavior: Behavior normal. Behavior is cooperative.     Results for orders placed or performed during the hospital encounter of 08/09/19  TSH  Result Value Ref Range   TSH 3.169 0.350 - 4.500 uIU/mL  Lipid panel  Result Value Ref Range   Cholesterol 172 0 - 200 mg/dL   Triglycerides 143 <150 mg/dL   HDL 36 (L) >40 mg/dL   Total CHOL/HDL Ratio 4.8 RATIO   VLDL 29 0 - 40 mg/dL   LDL Cholesterol 107 (H) 0 - 99 mg/dL  Hemoglobin A1c  Result Value Ref Range   Hgb A1c MFr Bld 8.5 (H) 4.8 - 5.6 %   Mean  Plasma Glucose 197.25 mg/dL  Comprehensive metabolic panel  Result Value Ref Range   Sodium 141 135 - 145 mmol/L   Potassium 4.0 3.5 - 5.1 mmol/L   Chloride 102 98 - 111 mmol/L   CO2 29 22 - 32 mmol/L   Glucose, Bld 129 (H) 70 - 99 mg/dL   BUN 15 6 - 20 mg/dL   Creatinine, Ser 0.54 0.44 - 1.00 mg/dL   Calcium 9.1 8.9 - 10.3 mg/dL   Total Protein 7.1 6.5 - 8.1 g/dL   Albumin 4.1 3.5 - 5.0 g/dL   AST 16 15 - 41 U/L   ALT 18 0 - 44 U/L   Alkaline Phosphatase 115 38 - 126 U/L   Total Bilirubin 0.6 0.3 - 1.2 mg/dL   GFR calc non Af Amer >60 >60 mL/min   GFR calc Af Amer >60 >60 mL/min   Anion gap 10 5 - 15      Assessment & Plan:    Encounter Diagnoses  Name Primary?  Marland Kitchen Uncontrolled type 2 diabetes mellitus with hyperglycemia ([place]) Yes  . Essential hypertension   . Hyperlipidemia, unspecified hyperlipidemia type   . Hypothyroidism, unspecified type   . Anxiety   . Obesity, unspecified classification, unspecified obesity type, unspecified whether serious comorbidity present      -reviewed labs with pt -Discussed better nutrition and she says she is getting $$ from relative to improver her diet.  She says this will help her DM and cholesterol so she doesn't think she needs to have her meds increased. -recommended regular exercise to help mood, sleep, stress levels, DM, chol, bp -pt to Continue current rx -she is encouraged to Get back on fish oil -will inccrease citalopram.  She is to contact office for any side effects or worsening mood -encouraged pt to check bp regularly.  Discussed that 140/80 is too high but that healthier diet and regular exercise could help lower it -pt is to follow up in person in 3 months.  She is to contact office sooner prn

## 2019-10-04 ENCOUNTER — Encounter: Payer: Self-pay | Admitting: Physician Assistant

## 2019-11-08 ENCOUNTER — Other Ambulatory Visit (HOSPITAL_COMMUNITY)
Admission: RE | Admit: 2019-11-08 | Discharge: 2019-11-08 | Disposition: A | Discharge status: Home / Self Care | Payer: Self-pay | Source: Ambulatory Visit | Attending: Physician Assistant | Admitting: Physician Assistant

## 2019-11-08 DIAGNOSIS — I10 Essential (primary) hypertension: Secondary | ICD-10-CM | POA: Insufficient documentation

## 2019-11-08 DIAGNOSIS — E785 Hyperlipidemia, unspecified: Secondary | ICD-10-CM | POA: Insufficient documentation

## 2019-11-08 DIAGNOSIS — E1165 Type 2 diabetes mellitus with hyperglycemia: Secondary | ICD-10-CM | POA: Insufficient documentation

## 2019-11-08 LAB — COMPREHENSIVE METABOLIC PANEL
ALT: 20 U/L (ref 0–44)
AST: 18 U/L (ref 15–41)
Albumin: 3.9 g/dL (ref 3.5–5.0)
Alkaline Phosphatase: 114 U/L (ref 38–126)
Anion gap: 10 (ref 5–15)
BUN: 13 mg/dL (ref 6–20)
CO2: 31 mmol/L (ref 22–32)
Calcium: 9.1 mg/dL (ref 8.9–10.3)
Chloride: 98 mmol/L (ref 98–111)
Creatinine, Ser: 0.57 mg/dL (ref 0.44–1.00)
GFR calc Af Amer: >60 mL/min (ref >60)
GFR calc non Af Amer: >60 mL/min (ref >60)
Glucose, Bld: 145 mg/dL — ABNORMAL HIGH (ref 70–99)
Potassium: 4 mmol/L (ref 3.5–5.1)
Sodium: 139 mmol/L (ref 135–145)
Total Bilirubin: 0.4 mg/dL (ref 0.3–1.2)
Total Protein: 7.4 g/dL (ref 6.5–8.1)

## 2019-11-08 LAB — HEMOGLOBIN A1C
Hgb A1c MFr Bld: 8.3 % — ABNORMAL HIGH (ref 4.8–5.6)
Mean Plasma Glucose: 191.51 mg/dL

## 2019-11-08 LAB — LIPID PANEL
Cholesterol: 176 mg/dL (ref 0–200)
HDL: 36 mg/dL — ABNORMAL LOW (ref >40)
LDL Cholesterol: 115 mg/dL — ABNORMAL HIGH (ref 0–99)
Total CHOL/HDL Ratio: 4.9 RATIO
Triglycerides: 126 mg/dL (ref <150)
VLDL: 25 mg/dL (ref 0–40)

## 2019-11-11 ENCOUNTER — Other Ambulatory Visit: Payer: Self-pay

## 2019-11-11 ENCOUNTER — Encounter: Payer: Self-pay | Admitting: Physician Assistant

## 2019-11-11 ENCOUNTER — Ambulatory Visit: Payer: Self-pay | Admitting: Physician Assistant

## 2019-11-11 VITALS — BP 130/70 | HR 92 | Temp 97.9°F

## 2019-11-11 DIAGNOSIS — E1165 Type 2 diabetes mellitus with hyperglycemia: Secondary | ICD-10-CM

## 2019-11-11 DIAGNOSIS — E039 Hypothyroidism, unspecified: Secondary | ICD-10-CM

## 2019-11-11 DIAGNOSIS — I10 Essential (primary) hypertension: Secondary | ICD-10-CM

## 2019-11-11 DIAGNOSIS — E785 Hyperlipidemia, unspecified: Secondary | ICD-10-CM

## 2019-11-11 DIAGNOSIS — F419 Anxiety disorder, unspecified: Secondary | ICD-10-CM

## 2019-11-11 DIAGNOSIS — E669 Obesity, unspecified: Secondary | ICD-10-CM

## 2019-11-11 MED ORDER — LEVOTHYROXINE SODIUM 25 MCG PO TABS: ORAL_TABLET | ORAL | 1 refills | Status: DC

## 2019-11-11 MED ORDER — AMLODIPINE BESYLATE 5 MG PO TABS: 5.0000 mg | ORAL_TABLET | Freq: Every day | ORAL | 0 refills | Status: DC

## 2019-11-11 MED ORDER — ATORVASTATIN CALCIUM 40 MG PO TABS: 40.0000 mg | ORAL_TABLET | Freq: Every day | ORAL | 1 refills | Status: DC

## 2019-11-11 MED ORDER — METFORMIN HCL 500 MG PO TABS: ORAL_TABLET | ORAL | 1 refills | Status: DC

## 2019-11-11 MED ORDER — CITALOPRAM HYDROBROMIDE 40 MG PO TABS: 40.0000 mg | ORAL_TABLET | Freq: Every day | ORAL | 3 refills | Status: DC

## 2019-11-11 MED ORDER — SITAGLIPTIN PHOSPHATE 100 MG PO TABS: 100.0000 mg | ORAL_TABLET | Freq: Every day | ORAL | 1 refills | Status: DC

## 2019-11-11 MED ORDER — LISINOPRIL-HYDROCHLOROTHIAZIDE 20-25 MG PO TABS: 1.0000 | ORAL_TABLET | Freq: Every day | ORAL | 1 refills | Status: DC

## 2019-11-11 MED ORDER — DICLOFENAC SODIUM 75 MG PO TBEC: 75.0000 mg | DELAYED_RELEASE_TABLET | Freq: Two times a day (BID) | ORAL | 0 refills | Status: DC | PRN

## 2019-11-11 NOTE — Progress Notes (Signed)
BP 130/70   Pulse 92   Temp 97.9 F (36.6 C)   SpO2 96%    Subjective:    Patient ID: Ashley Chase, female    DOB: 1961-05-22, 59 y.o.   MRN: [id number]  HPI: Ashley Chase is a 59 y.o. female presenting on 11/11/2019 for Diabetes, Hypertension, Hyperlipidemia, and Thyroid Problem   HPI   Pt had a negative covid 19 screening questionnaire.     Pt is 25yoF with DM, htn, dyslipidemia, hypothyroidsm and anxiety  Pt monitoring bp at home and she says it is good.  Pt stopped the atenolol because she was dizzy.  She hasn't been dizzy since.  For some reason she self-restarted the amlodipine that she had self-disconintued prior to her 02/05/19 appointment saying that it made her bp go too low.     She is still working as a Building control surveyor.  She says she has no complaints.      Relevant past medical, surgical, family and social history reviewed and updated as indicated. Interim medical history since our last visit reviewed. Allergies and medications reviewed and updated.    Current Outpatient Medications:  .  amLODipine (NORVASC) 5 MG tablet, Take 5 mg by mouth daily., Disp: , Rfl:  .  atorvastatin (LIPITOR) 20 MG tablet, Take 1 tablet (20 mg total) by mouth daily., Disp: 90 tablet, Rfl: 1 .  citalopram (CELEXA) 40 MG tablet, Take 1 tablet (40 mg total) by mouth daily., Disp: 30 tablet, Rfl: 3 .  diclofenac (VOLTAREN) 75 MG EC tablet, Take 1 tablet (75 mg total) by mouth 2 (two) times daily as needed., Disp: 30 tablet, Rfl: 0 .  Insulin Glargine (LANTUS SOLOSTAR) 100 UNIT/ML Solostar Pen, Inject 40 Units into the skin at bedtime., Disp: 5 pen, Rfl: PRN .  levothyroxine (SYNTHROID) 25 MCG tablet, TAKE 1 Tablet BY MOUTH ONCE DAILY BEFORE BREAKFAST, Disp: 90 tablet, Rfl: 1 .  lisinopril-hydrochlorothiazide (ZESTORETIC) 20-25 MG tablet, Take 1 tablet by mouth daily., Disp: 90 tablet, Rfl: 1 .  loratadine (CLARITIN) 10 MG tablet, Take 10 mg by mouth daily as needed for allergies., Disp: , Rfl:   .  metFORMIN (GLUCOPHAGE) 500 MG tablet, TAKE 1 Tablet  BY MOUTH TWICE DAILY WITH A MEAL, Disp: 180 tablet, Rfl: 1 .  Omega-3 Fatty Acids (FISH OIL) 1000 MG CAPS, 1 po qd, Disp: , Rfl: 0 .  sitaGLIPtin (JANUVIA) 100 MG tablet, Take 1 tablet (100 mg total) by mouth daily., Disp: 90 tablet, Rfl: 1 .  atenolol (TENORMIN) 25 MG tablet, Take 1 tablet (25 mg total) by mouth daily. (Patient not taking: Reported on 11/11/2019), Disp: 30 tablet, Rfl: 3     Review of Systems  Per HPI unless specifically indicated above     Objective:    BP 130/70   Pulse 92   Temp 97.9 F (36.6 C)   SpO2 96%   Wt Readings from Last 3 Encounters:  02/05/19 192 lb 6.4 oz (87.3 kg)  05/08/18 188 lb 4 oz (85.4 kg)  03/28/18 186 lb 8 oz (84.6 kg)    Physical Exam Vitals reviewed.  Constitutional:      General: She is not in acute distress.    Appearance: She is well-developed. She is not ill-appearing.  HENT:     Head: Normocephalic and atraumatic.  Cardiovascular:     Rate and Rhythm: Normal rate and regular rhythm.  Pulmonary:     Effort: Pulmonary effort is normal.     Breath sounds: Normal  breath sounds.  Abdominal:     General: Bowel sounds are normal.     Palpations: Abdomen is soft. There is no mass.     Tenderness: There is no abdominal tenderness.  Musculoskeletal:     Cervical back: Neck supple.     Right lower leg: No edema.     Left lower leg: No edema.  Lymphadenopathy:     Cervical: No cervical adenopathy.  Skin:    General: Skin is warm and dry.  Neurological:     Mental Status: She is alert and oriented to person, place, and time.  Psychiatric:        Attention and Perception: Attention normal.        Speech: Speech normal.        Behavior: Behavior normal. Behavior is cooperative.     Results for orders placed or performed during the hospital encounter of 11/08/19  Lipid panel  Result Value Ref Range   Cholesterol 176 0 - 200 mg/dL   Triglycerides 591 <638 mg/dL   HDL  36 (L) >46 mg/dL   Total CHOL/HDL Ratio 4.9 RATIO   VLDL 25 0 - 40 mg/dL   LDL Cholesterol 659 (H) 0 - 99 mg/dL  Comprehensive metabolic panel  Result Value Ref Range   Sodium 139 135 - 145 mmol/L   Potassium 4.0 3.5 - 5.1 mmol/L   Chloride 98 98 - 111 mmol/L   CO2 31 22 - 32 mmol/L   Glucose, Bld 145 (H) 70 - 99 mg/dL   BUN 13 6 - 20 mg/dL   Creatinine, Ser 9.35 0.44 - 1.00 mg/dL   Calcium 9.1 8.9 - 70.1 mg/dL   Total Protein 7.4 6.5 - 8.1 g/dL   Albumin 3.9 3.5 - 5.0 g/dL   AST 18 15 - 41 U/L   ALT 20 0 - 44 U/L   Alkaline Phosphatase 114 38 - 126 U/L   Total Bilirubin 0.4 0.3 - 1.2 mg/dL   GFR calc non Af Amer >60 >60 mL/min   GFR calc Af Amer >60 >60 mL/min   Anion gap 10 5 - 15  Hemoglobin A1c  Result Value Ref Range   Hgb A1c MFr Bld 8.3 (H) 4.8 - 5.6 %   Mean Plasma Glucose 191.51 mg/dL      Assessment & Plan:     Encounter Diagnoses  Name Primary?  Marland Kitchen Uncontrolled type 2 diabetes mellitus with hyperglycemia ([facility]) Yes  . Essential hypertension   . Hyperlipidemia, unspecified hyperlipidemia type   . Hypothyroidism, unspecified type   . Anxiety   . Obesity, unspecified classification, unspecified obesity type, unspecified whether serious comorbidity present      -Reviewed labs with pt -will Increase atorvasttai.  Pt to Continue lowfat diet -will Increase insulin to 50units daily.  Will Continue januvia and metformin.  (will not increase metformin due to pt has diarrhea with metformin 1 g).  Pt is to monitor her bs.  She is reminded to contact office for fbs < 70 or > 300 --Pt to return iFOBT that she was given previously for colon cancer screening -will Refer to RD for problems with eating- pt says she is confused and doesn't know what to eat with the diabetic diet and the lowfat diet.  She has been given reading information on this -pt counseled on the risks of self-starting and self-discontinuing her medications.  Discussed with pt that if she is having issues  she should contact the office.  She states understanding. -pt  to follow up 3 months.  She is to contact office sooner prn

## 2019-11-26 ENCOUNTER — Encounter: Payer: Self-pay | Attending: Physician Assistant | Admitting: Nutrition

## 2019-11-26 ENCOUNTER — Other Ambulatory Visit: Payer: Self-pay

## 2019-11-26 VITALS — Ht 61.0 in | Wt 192.2 lb

## 2019-11-26 DIAGNOSIS — I10 Essential (primary) hypertension: Secondary | ICD-10-CM | POA: Insufficient documentation

## 2019-11-26 DIAGNOSIS — E782 Mixed hyperlipidemia: Secondary | ICD-10-CM | POA: Insufficient documentation

## 2019-11-26 DIAGNOSIS — E669 Obesity, unspecified: Secondary | ICD-10-CM | POA: Insufficient documentation

## 2019-11-26 DIAGNOSIS — E1165 Type 2 diabetes mellitus with hyperglycemia: Secondary | ICD-10-CM | POA: Insufficient documentation

## 2019-11-26 NOTE — Progress Notes (Signed)
  Medical Nutrition Therapy:  Appt start time: 1500 end time:  1600.   Assessment:  Primary concerns today: DM Type 2. She has been .  Has had DM for 6 years. [facility]  She is a PCA. Doesn't have insurance.  50 units of Lantus and Metormin 500 mg BID and Januviia 100 mg a day. Gained 20 lbs in the last year., Has depression. Doesn't sleep well at night. Feels like to eat too much being at home and being depressed. Emotional eater. Takes Celexa daily. Has neuropathy in hands and feet and arithritis. Lab Results  Component Value Date   HGBA1C 8.3 (H) 11/08/2019   CMP Latest Ref Rng & Units 11/08/2019 08/09/2019 05/03/2019  Glucose 70 - 99 mg/dL 161(W) 960(A) 540(J)  BUN 6 - 20 mg/dL 13 15 18   Creatinine 0.44 - 1.00 mg/dL 8.11 9.14  Sodium 135 - 145 mmol/L 139 141 137  Potassium 3.5 - 5.1 mmol/L 4.0 4.0 4.1  Chloride 98 - 111 mmol/L 98 102 101  CO2 22 - 32 mmol/L 31 29 26   Calcium 8.9 - 10.3 mg/dL 9.1 9.1 9.0  Total Protein 6.5 - 8.1 g/dL 7.4 7.1 7.2  Total Bilirubin 0.3 - 1.2 mg/dL 0.4 0.6 0.6  Alkaline Phos 38 - 126 U/L 114 115 112  AST 15 - 41 U/L 18 16 17   ALT 0 - 44 U/L 20 18 16    Lipid Panel     Component Value Date/Time   CHOL 176 11/08/2019 0858   TRIG 126 11/08/2019 0858   HDL 36 (L) 11/08/2019 0858   CHOLHDL 4.9 11/08/2019 0858   VLDL 25 11/08/2019 0858   LDLCALC 115 (H) 11/08/2019 0858     . Preferred Learning Style:   No preference indicated   Learning Readiness: Goals    Ready  Change in progress   MEDICATIONS:    DIETARY INTAKE:  24-hr recall:  B)  9 am  2 slices 2ww toast and scrambled eggs and 1 slice bacon, coffee-1/2 and 1/2 creamer with stevia.  10 am: fruit cut up-4 strawberries, mandarian orages or blueberies L)  1-2 pm sparking water flavored , pretzels. S) 2 Chicken thighs, dry rub, green beans, mandarian orange and 10 grapes,  Water or occasionally Diet soda.  Usual physical activity: ADL  Estimated energy  needs: 1200  calories 135 g carbohydrates 90 g protein 33 g fat  Progress Towards Goal(s):  In progress.   Nutritional Diagnosis:  NB-1.1 Food and nutrition-related knowledge deficit As related to Diabetes Type 2.  As evidenced by A1C 8.3%.    Intervention:  Nutrition and Diabetes education provided on My Plate, CHO counting, meal planning, portion sizes, timing of meals, avoiding snacks between meals unless having a low blood sugar, target ranges for A1C and blood sugars, signs/symptoms and treatment of hyper/hypoglycemia, monitoring blood sugars, taking medications as prescribed, benefits of exercising 30 minutes per day and prevention of complications of DM.  Lab Results  Component Value Date   HGBA1C 8.3 (H) 11/08/2019     Teaching Method Utilized:  Visual Auditory Hands on  Handouts given during visit include:  The Plate Method   Meal Plan Card  Diabetes Instructions.   Barriers to learning/adherence to lifestyle change: none  Demonstrated degree of understanding via:  Teach Back   Monitoring/Evaluation:  Dietary intake, exercise, and body weight in 1 month(s).

## 2019-11-27 ENCOUNTER — Telehealth: Payer: Self-pay

## 2019-11-27 NOTE — Telephone Encounter (Signed)
Pt was returning phone call related to Enrollment of  Remote Monitoring Hypertension   Pt was initially contacted for purposes obtaining  Enrollment.   Pt was recruited and referred by her medical provider at the [facility] for the purposes of  BP management and heath coaching  Pt stated her readiness to move forth with the enrollment process after learning of the program requirements and guidelines.    She stated that she had the appropriate Wi-Fi Connections and Smartphone device to obtain the connectivity needed for BP and Scale Readings     Enrollment appt was made for Tuesday, December 03 2019 at 2:30pm at [facility] , [address] [place] , [place]  Program enrollment information and requirements were sent by email to patient to review prior to appointment date

## 2019-12-03 ENCOUNTER — Encounter: Payer: Self-pay | Admitting: Nutrition

## 2019-12-03 NOTE — Patient Instructions (Signed)
Goals  Follow MY Plate Eat three meals per day Eat 30-45 g CHO at meals. Walk 30   Minutes 3 times per week Drink only water  Avoid snacks Get A1C down to 7% or less. Cut out fast food and fried foods.

## 2019-12-04 DIAGNOSIS — Z139 Encounter for screening, unspecified: Secondary | ICD-10-CM

## 2019-12-04 LAB — GLUCOSE, POCT (MANUAL RESULT ENTRY): POC Glucose: 192 mg/dl — AB (ref 70–99)

## 2019-12-04 NOTE — Congregational Nurse Program (Signed)
Pt presents to clinic to obtain a random blood sugar check due to not having any more testing strips.  States she was last able to check on blood glucose on 5.28.21 which was 166 fasting  Pt also reports prior to arriving to clinic and per my referral, she was able to pick up 2 [facility] prior to attending the clinic for blood glucose check.  Random Blood Sugar was checked and was 192 (last ate or drank -1 30pm)  She states she is now able to get back on her routine with checking her blood sugar 1-2 times a day a per instructions of her   her provider at the [facility]  No additional referrals or resources needed

## 2019-12-04 NOTE — Congregational Nurse Program (Signed)
Patient presents to [facility] on today to initiate enrollment in the Remote Monitoring in the [place]-Hypertension Program and states her readiness to participate.  She states she is need of a program like this due to the desire to want to overcome her past  lack of motivation, knowing how to avoid stressors of everyday life, better understand how to monitor her BP (never done)  and to make better choices within her diet and activity level.  Pt successfully onboarded with initial BP and scale test readings that connected  to the [employer] App and provider dashboard successfully.   Initial BP Test reading was 126/79and weight test reading of 198.8 with shoes and clothes on   Pt was provided with Remote Monitoriing in the [facility]HT[facility] patient facing booklet that reviewed all program requirements, guidelines, dash diet brochure/guidelines, proper techinque for monitoring and troubleshooting equipment.   -Reviewed and verified patient's BP Medication and consistency of use with patient.   - Pt understood guidelines and expectations of program,  -Pt understood and demonstrated proper use of BP and Scale equipment by way of teachbacks after it was demonstrated to her - Pt was provided BP Basics Manual with a coordinating BP-Personal Action Plan that was completed on site and placed in chart --Pt identified and created her personal action plan goals successully           Plan:        -Pt will check BP twice daily 6-6:30am and 5-5:30pm -Pt will monitor her weight  once (1) daily without clothing in the morning -Pt will increase her physical activity to 15 mins/1 x day for  6 days -Pt will increase her activity to consist of moving more through light housekeeping  -Pt will commit to using her health app on her phone to monitor the amount of activity she is doing.   -Pt will continue to f/u with provider as recommended and continue medications as prescribed -1st Weekly F/U will be made on  (Mon) 6/8//21 to check in regarding 1st day use of the equipment and to address any questions -Next Day/Check in F/U will be conducted on (Wed) 12/04/19  -RN Case Manager will review  regarding BP result 7 average and trends  Additional Resource Request While at visit patient addressed her need to obtain blood glucose strips for her glucometer.  She also expressed the shortage on finances to obtain food at times and having to rely on child to assist her  Plan Pt was provided [employer] information to assist with obtaining healthy foods   Pt was advised to forward glucometer information/type in order to assist with helping her obtain the glucose strips to better monitor her blood glucose.

## 2019-12-04 NOTE — Telephone Encounter (Signed)
Pt was sent a  appointment reminder for her onboarding/enrollment via text message

## 2019-12-05 ENCOUNTER — Encounter: Payer: Self-pay | Admitting: Physician Assistant

## 2019-12-12 ENCOUNTER — Telehealth: Payer: Self-pay

## 2019-12-12 NOTE — Telephone Encounter (Signed)
Ptwas f/u by phone call for weekly update to address any challenges or successes of BP and Weight checks and Personal Action Plan. Pt states she is doing well         Personal Action Plan Timeline -Pt willcheck BP twice daily6-6:30amand 5-5:30pm  (ON [employer]) -Pt will monitor her weight once (1) daily withoutclothing in the morning (ON TARGET) -Pt will increase herphysicalactivityto 15 mins/1 x day for  6 days  (ON TARGET)  (conducted walking in yard/ performed about 5 days on last week) totals= 90 mins/week/moderate active -Pt willincrease her activity to consist of moving more through light housekeeping  (ON TARGET)  (conducted 3.5 hrs a day between work and home)  (activity totals=210 mins/week movement -Pt will commit to using her health app on her phone to monitor the amount of activity she is doing.  (ON TARGET)   -Pt will continue to f/u with provider as recommended and continue medications as prescribed  (ON TARGET)  BARRIERS _-Wants to increase physical activity more than current goals, but often feels very tired and exhausted which makes her feel as though she can't do much more   PERSONAL ACTION PLAN REVISIONS -No changes to original personal action plan at this time related to diet, exercise and monitoring weight -Added to Plan  (Following Healthy Eating) states she eats baked foods, chicken breast, salads,spinanch, peach, mandarian orange, strawberries, apples, 1/2 banana, blueberries and do do not eat after 6pm  Health Coach Advice/Plan -Follow up with patient on Monday, 12/16/19

## 2019-12-16 NOTE — Congregational Nurse Program (Signed)
  Reviewed onboarding notes and client's personal action plan as per Maia Plan LPN and Client.  RN agrees with stated action plan.   Plan: to monitor client's reported remote blood pressure readings for trends or escalations. Review weekly in conference with W. Jaci Lazier LPN client's progress and discuss any barriers or trends and discuss any needed changes to plan of care.  Will every 7 days review and record client's 7 day average blood pressure readings for review by [facility] provider.    Francee Nodal RN  Care Connect/Clara Adline Potter

## 2019-12-16 NOTE — Congregational Nurse Program (Signed)
   Reviewed last 7 day reported blood pressure readings from [facility] remote patient monitoring program in collaboration with [facility].   Last 7 day averages: Client enrolled in program 12/04/19  Last 7 day average blood pressure reading: 118/74 on target Highest blood pressure reading 128/81 Lowest reported blood pressure reading: 110/71   Plan:  Will continue to monitor client's daily reported readings  Will review weekly in conference with Maia Plan LPN client's progress and any reported barriers to meeting targets.   Client will continue with weekly and as needed Health coaching from [place] crowder LPN per program guidelines.   Will follow up with client regarding diet and her blood sugar readings. Client has had an appointment with Pola Corn Registered Dietician for diabetic nutritional guidelines.  Will add any further guidance for Diabetes diet with incorporation of DASH guidelines for her blood pressure.   Francee Nodal RN Care connect/Clara Adline Potter

## 2019-12-17 ENCOUNTER — Telehealth: Payer: Self-pay

## 2019-12-17 NOTE — Telephone Encounter (Signed)
Ptwas f/u byphonecall for weekly update to address any challenges or successes of BP and Weight checks and Personal Action Plan. Pt states she is doing well regarding the use of her bp and scale equipment.  She states she did exercise    Personal Action Plan Timeline -Pt willcheck BP twice daily6-6:30amand5-5:30pm  (ON [employer]) -Pt will monitor her weight once (1) daily withoutclothingin the morning (ON TARGET) -Pt will increase herphysicalactivityto 15 mins/1 x day for 6days  (ON TARGET)  (continue to conduct walking in yard) -Pt willincreaseher activityto consist of moving more through light housekeeping  (ON TARGET)  (continue to conduct 3.5 hrs a day between work and home)  (activity totals=210 mins/week movement -Pt will commit to using her health app on her phone to monitor the amount of activity she is doing.(ON TARGET)  continues to use health app on phone -Pt will continue to f/u with provider as recommended and continue medications as prescribed  (ON TARGET) -Pt will following Healthy Eating plan- (ON TARGET) states she eats baked foods, chicken breast, salads,spinanch, peach, mandarian orange, strawberries, apples, 1/2 banana, blueberries and do do not eat after 6pm  BARRIERS   -Injury to heal of foot after falling up stairs that may prohibit increasing activity on next week  PERSONAL ACTION PLANREVISIONS -No changesto original personal action plan at this time related todiet and monitoring weight -May have to limit amount of walking exercise activity until foot injury gets better   Health Coach Advice/Plan  -Follow up with patient on Monday, 6//21

## 2019-12-18 NOTE — Congregational Nurse Program (Signed)
Reviewed last 7 day reported blood pressure readings from [facility] remote patient monitoring program in collaboration with [facility].  Reviewed follow up note from [facility] LPN on 08/12/88 and agree with action plan.  Last 7 day averages: Client enrolled in program   Last 7 day average blood pressure reading: 115/73 on target Highest blood pressure reading 127/80 Lowest reported blood pressure reading: 106/70   Plan:  Will continue to monitor client's daily reported readings  Will review weekly in conference with Lissa Hoard LPN client's progress and any reported barriers to meeting targets.   Client will continue with weekly and as needed Health coaching from [place] crowder LPN per program guidelines.   Will follow up with client regarding diet and her blood sugar readings. Client has had an appointment with Tamsen Roers Registered Dietician for diabetic nutritional guidelines.  Will add any further guidance for Diabetes diet with incorporation of DASH guidelines for her blood pressure. * Client is now incorporating exercise to daily routine ON [employer].  Will plan to call client this afternoon to follow up with blood sugars and diet changes regarding diabetes. Client has met with Diabetic Nutritional Dietician Tamsen Roers and has an upcoming appointment 01/14/20  Debria Garret RN Care connect/Clara Sandi Mariscal

## 2019-12-26 NOTE — Congregational Nurse Program (Signed)
Reviewed last 7 day reported blood pressure readings from [facility] remote patient monitoring program in collaboration with [facility].   Last 7 day averages:   Last 7 day average blood pressure reading: 117/75 on target Highest blood pressure reading 131/79 Lowest reported blood pressure reading: 103/62   Plan:  Will continue to monitor client's daily reported readings  Will review weekly in conference with Maia Plan LPN client's progress and any reported barriers to meeting targets and changes to client's personal action plan.   Client will continue with weekly and as needed Health coaching from [place] crowder LPN per program guidelines.   Will follow up with client regarding diet and her blood sugar readings. Discussed with client on 12/18/19 her past appointment with Pola Corn. Discussed her daily routine as far a work as well as meal choices and preferences. Client prefers reading materials and would like some Diabetic friend information regarding healthier snacks and ideas for fruits as well as main meal ideas and preferences. Client has not applied for food stamps but would be willing to apply. [place] MSW summer intern will contact to arrange date to assist client. Will then have recipes and other information for client. Will add any further guidance for Diabetes diet with incorporation of DASH guidelines for her blood pressure. * Client is now incorporating exercise to daily routine ON [employer].  Will continue to monitor daily uploads of blood pressure readings to [facility] portal and not any trending escalations and manage per program protocol.    Francee Nodal RN Care connect/Clara Adline Potter

## 2019-12-30 ENCOUNTER — Telehealth: Payer: Self-pay

## 2019-12-30 NOTE — Telephone Encounter (Signed)
Ptwas f/u byphonecallfor weekly update to address any challenges or successes of BP and Weight checks and Personal Action Plan. Pt states she is doing well regarding the use of her bp and scale equipment.  She states her successes on last week was that she is seeing a slight reduction in her weight and that she was able to perform more walking compared to the previous weekl   Personal Action Plan Timeline (Unchanged) -Pt willcheck BP twice daily6-6:30amand5-5:30pm(ON TARGET) -Pt will monitor her weight once (1) daily withoutclothingin the morning(ON TARGET) -Pt will increase herphysicalactivityto 15 mins/1 x day for 6days(ON TARGET) (continue to conduct walking in yard) -Pt willincreaseher activityto consist of moving more through light housekeeping(ON TARGET) (continue to conduct 3.5 hrs a day between work and home) (activity totals=210 mins/week movement -Pt will commit to using her health app on her phone to monitor the amount of activity she is doing.(ON TARGET) continues to use health app on phone -Pt will continue to f/u with provider as recommended and continue medications as prescribed(ON TARGET) -Pt will following Healthy Eating plan- (ON TARGET) states she eats baked foods, chicken breast, salads,spinanch, peach, mandarian orange, strawberries, apples, 1/2 banana, blueberries and do do not eat after 6pm  BARRIERS   -No barriers identified this week as it relates to action  plan  PERSONAL ACTION PLANREVISIONS -No changesto original personal action plan at this time related todiet and monitoring weight -Utilze new Fit Bit to record her steps to help increase her exercise actvity    Health Coach Advice/Plan  -Follow up with patient on Monday, 6/6

## 2020-01-07 NOTE — Congregational Nurse Program (Signed)
Reviewed on 12/31/19 client's last 7 day blood pressure averages. Based on 24 readings reported.  Last 7 day Average: 114/72 on target Last 7 day highest blood pressure average:122/78 Last 7 day lowest blood pressure average 103/70   Client has continued to consistently met goals of personal action plan.  Discussed and reviewed client's progress with W. Crowder LPN  No readings noted for escalation or upward trending.  Will continue to monitor daily readings for any escalations.  Plan: Client will continue health coaching with W. Crowder LPN   Debria Garret RN  Clara Gunn/care connect

## 2020-01-08 ENCOUNTER — Telehealth: Payer: Self-pay

## 2020-01-08 NOTE — Telephone Encounter (Signed)
Ptwas f/u byphonecallfor weekly update to address any challenges or successes of BP and Weight checks and Personal Action Plan. Pt states she is doing wellregarding the use of her bp and scale equipment. States she feels happy about her progress she has made and how she is managing her BP.      Personal Action Plan Timeline (Unchanged) -Pt willcheck BP twice daily6-6:30amand5-5:30pm(ON TARGET) -Pt will monitor her weight once (1) daily withoutclothingin the morning(ON TARGET) -Pt will increase herphysicalactivityto 15 mins/1 x day for 6days(ON TARGET) (now has a "fit n [place]" watch and app that she monitors her steps with daily averaging about 8,000-10,000 steps daily) -Pt willincreaseher activityto consist of moving more through light housekeeping(ON TARGET) ( continues to be active through lighthouskeeping at home/ work) went out walking and window shop this past week and continues to walk around the parameters of her yard about 15 mins/daily most days out of 7days) , -Pt will commit to using her health app on her phone to monitor the amount of activity she is doing.(ON TARGET)(Sister bought a fitness watch to help maintain her fitness goals as of 7/1 -Pt will continue to f/u with provider as recommended and continue medications as prescribed(ON TARGET) -Pt will following Healthy Eatingplan- (ON TARGET)states she eats baked foods, chicken breast, salads,spinanch, peach, mandarian orange, strawberries, apples, 1/2 banana, blueberries and do do not eat after 6pm  BARRIERS -Mainly no barriers, but did miss 2 days of walking due to having a visitor and she was entertaining them and did not have time as normal  SUCCESSES -States she is eating better and more healthier -More Active compared to past -Continues with successfully monitoring her weight and BP  PERSONAL ACTION PLANREVISIONS -No changesto original personal action plan at this time related  todietandmonitoring weight    Health Coach Advice/Plan -Follow up with patient on Monday, 7/12

## 2020-01-10 NOTE — Congregational Nurse Program (Signed)
Reviewed client's past 7 day blood pressure recordings as of 01/08/20 within the [facility] portal. Client has continued with consistent monitoring of weight and blood pressure using platform and equipment provided. Client is on target meeting personal action plan and goals set by client and Lissa Hoard LPN.   The following are her last 7 days average blood pressure readings as of 01/08/20 21 readings recorded 7 day average reading: 114/72 on target. 7 day average highest reading: 123/78 7 day average lowest reading: 106/68  Plan: Client will continue health coaching and weekly support by W. Crowder LPN.  RN will continue to review daily readings and note any trends or escalations and report those as needed to provider at [facility] per program guidelines.  RN will continue to note 7 day blood pressure reading average and note for provider within EMR.  RN and Marnee Spring LPN will discuss weekly client's progress, goals and any barriers to meeting goals.  Note: client is also a DM and per her request recipes have been mailed to her for ideas regarding different meal options and healthier snack ideas.   Will continue to add support and monitor A1Cs. Client has met with dietician and received DM education verbally as well as written information. Client prefers written information that she can read and review per conversation.  Client is meeting target goals and is following monitoring goals, medication recommendations by provider and has increased her exercise per personal action plan and has recently added  A fit watch to assist in tracking exercise.   Debria Garret RN

## 2020-01-14 ENCOUNTER — Ambulatory Visit: Payer: Self-pay | Admitting: Nutrition

## 2020-01-17 NOTE — Congregational Nurse Program (Signed)
Reviewed client's last 7 day recorded readings for averaging for the Derenda Mis remote patient monitoring for hypertension in collaboration with [facility]. Client continues to be consistent in monitoring per program guidelines and is on target for her goal blood pressure readings.  The Last 7 day readings uploaded to [employer] platform are as follows   Last 7 day average blood pressure readings : 112/70  Last 7 day average lowest reading  119/67  Last 7 day lowest average reading 104/68  Plan:  Will continue to monitor client's blood pressure for any trends in escalation which would require intervention per program guidelines.  Will continue to meet with Chrisandra Netters LPN weekly to discuss client's progress and client will continue to have weekly health coaching with client .  Francee Nodal RN

## 2020-01-28 ENCOUNTER — Other Ambulatory Visit: Payer: Self-pay | Admitting: Physician Assistant

## 2020-01-28 MED ORDER — ATORVASTATIN CALCIUM 40 MG PO TABS: 40.0000 mg | ORAL_TABLET | Freq: Every day | ORAL | 1 refills | Status: DC

## 2020-01-28 MED ORDER — AMLODIPINE BESYLATE 5 MG PO TABS: 5.0000 mg | ORAL_TABLET | Freq: Every day | ORAL | 0 refills | Status: DC

## 2020-01-28 NOTE — Congregational Nurse Program (Signed)
7 day averages of remote patient monitoring program with [facility] with [facility].  Last 7 day averages are as follows:  7 day average recorded reading: 113/69 On target  7 day average highest recorded reading: 119/67  7 day average lowest recorded reading: 107/71  Client is consistently meeting goals of monitoring and following goals set at personal action plan.  Plan: RN will continue to monitor blood pressure recordings daily during business hours for trending escalations that would require intervention.  RN and Maia Plan LPN will continue to meet weekly to discuss client's progress at meeting set goals and note any barriers that may need to be addressed.  Client will continue to follow up weekly with Maia Plan LPN for health coaching per program guidelines.   Francee Nodal RN  [employer] [employer]

## 2020-02-05 NOTE — Congregational Nurse Program (Signed)
Follow up for Remote patient monitoring program for [facility]ch allyn in [facility] with [facility]. Client is consistently monitoring per program guidelines.  Last 7 day averages of recorded blood pressures:  Last 7 day average reported blood pressure: 115/71 ON TARGET  Last 7 day highest average blood pressure: 122/76  Last 7 day lowest averaged blood pressure: 105/71  PLAN:  RN will continue to monitor blood pressures daily and note any escalations or trends that would require intervention per program guidelines.  Client will continue with weekly health coaching sessions by Maia Plan LPN and any changes or new personal action plan goals will be documented.  Will continue to meet weekly with W. Crowder LPN to discuss client's progress and address any barriers to meeting plan goals.

## 2020-02-07 NOTE — Congregational Nurse Program (Signed)
Last 7 day averages of Derenda Mis Remote Patient monitoring program for hypertension in [facility] with [facility].  Noted client is consistently monitoring per program guidelines and is consistent with medication adherence and recommended by her medical provider at the [facility].  Last 7 day averages of recorded blood pressure readings:  Last 7 day average recorded blood pressure: 116/71 ON TARGET.  Last 7 day average recorded highest reading: 121/77  Last 7 day average recorded lowest reading: 11/69   Plan:  RN will continue to monitor blood pressure readings daily during clinic hours and monitor for any escalations or trends that would require intervention: Note [facility] is [facility]20 will send provider log of readings for review.   Client will continue weekly health coaching with Maia Plan LPN and continue to assess goals and adjust personal action plan as needed.   RN will continue to meet weekly with W. Crowder LPN to discuss client's progress and any potential barriers to client for meeting goals of program and her personal action plan.   Francee Nodal RN  [facility]

## 2020-02-10 ENCOUNTER — Other Ambulatory Visit (HOSPITAL_COMMUNITY)
Admission: RE | Admit: 2020-02-10 | Discharge: 2020-02-10 | Disposition: A | Discharge status: Home / Self Care | Payer: Self-pay | Source: Ambulatory Visit | Attending: Physician Assistant | Admitting: Physician Assistant

## 2020-02-10 DIAGNOSIS — I10 Essential (primary) hypertension: Secondary | ICD-10-CM | POA: Insufficient documentation

## 2020-02-10 DIAGNOSIS — E1165 Type 2 diabetes mellitus with hyperglycemia: Secondary | ICD-10-CM | POA: Insufficient documentation

## 2020-02-10 DIAGNOSIS — E785 Hyperlipidemia, unspecified: Secondary | ICD-10-CM | POA: Insufficient documentation

## 2020-02-10 DIAGNOSIS — E039 Hypothyroidism, unspecified: Secondary | ICD-10-CM | POA: Insufficient documentation

## 2020-02-10 LAB — TSH: TSH: 2.986 u[IU]/mL (ref 0.350–4.500)

## 2020-02-10 LAB — LIPID PANEL
Cholesterol: 156 mg/dL (ref 0–200)
HDL: 38 mg/dL — ABNORMAL LOW (ref >40)
LDL Cholesterol: 102 mg/dL — ABNORMAL HIGH (ref 0–99)
Total CHOL/HDL Ratio: 4.1 RATIO
Triglycerides: 80 mg/dL (ref <150)
VLDL: 16 mg/dL (ref 0–40)

## 2020-02-10 LAB — COMPREHENSIVE METABOLIC PANEL
ALT: 16 U/L (ref 0–44)
AST: 17 U/L (ref 15–41)
Albumin: 4 g/dL (ref 3.5–5.0)
Alkaline Phosphatase: 99 U/L (ref 38–126)
Anion gap: 11 (ref 5–15)
BUN: 16 mg/dL (ref 6–20)
CO2: 28 mmol/L (ref 22–32)
Calcium: 8.8 mg/dL — ABNORMAL LOW (ref 8.9–10.3)
Chloride: 101 mmol/L (ref 98–111)
Creatinine, Ser: 0.56 mg/dL (ref 0.44–1.00)
GFR calc Af Amer: >60 mL/min (ref >60)
GFR calc non Af Amer: >60 mL/min (ref >60)
Glucose, Bld: 110 mg/dL — ABNORMAL HIGH (ref 70–99)
Potassium: 3.6 mmol/L (ref 3.5–5.1)
Sodium: 140 mmol/L (ref 135–145)
Total Bilirubin: 0.6 mg/dL (ref 0.3–1.2)
Total Protein: 7.2 g/dL (ref 6.5–8.1)

## 2020-02-10 LAB — HEMOGLOBIN A1C
Hgb A1c MFr Bld: 7.3 % — ABNORMAL HIGH (ref 4.8–5.6)
Mean Plasma Glucose: 162.81 mg/dL

## 2020-02-11 ENCOUNTER — Other Ambulatory Visit: Payer: Self-pay

## 2020-02-11 ENCOUNTER — Ambulatory Visit: Payer: Self-pay | Admitting: Physician Assistant

## 2020-02-11 ENCOUNTER — Other Ambulatory Visit: Payer: Self-pay | Admitting: Student

## 2020-02-11 ENCOUNTER — Encounter: Payer: Self-pay | Admitting: Physician Assistant

## 2020-02-11 VITALS — BP 130/80 | HR 97 | Temp 97.7°F | Ht 60.25 in | Wt 190.2 lb

## 2020-02-11 DIAGNOSIS — M722 Plantar fascial fibromatosis: Secondary | ICD-10-CM

## 2020-02-11 DIAGNOSIS — Z1239 Encounter for other screening for malignant neoplasm of breast: Secondary | ICD-10-CM

## 2020-02-11 DIAGNOSIS — E785 Hyperlipidemia, unspecified: Secondary | ICD-10-CM

## 2020-02-11 DIAGNOSIS — E039 Hypothyroidism, unspecified: Secondary | ICD-10-CM

## 2020-02-11 DIAGNOSIS — E669 Obesity, unspecified: Secondary | ICD-10-CM

## 2020-02-11 DIAGNOSIS — E118 Type 2 diabetes mellitus with unspecified complications: Secondary | ICD-10-CM

## 2020-02-11 DIAGNOSIS — Z1231 Encounter for screening mammogram for malignant neoplasm of breast: Secondary | ICD-10-CM

## 2020-02-11 DIAGNOSIS — I10 Essential (primary) hypertension: Secondary | ICD-10-CM

## 2020-02-11 DIAGNOSIS — F419 Anxiety disorder, unspecified: Secondary | ICD-10-CM

## 2020-02-11 LAB — MICROALBUMIN, URINE: Microalb, Ur: 11.1 ug/mL — ABNORMAL HIGH

## 2020-02-11 NOTE — Congregational Nurse Program (Signed)
Note, follow up regarding Remote Patient monitoring for hypertension. Client has appointment today with [facility] provider. Delivered up to date log of client's reported blood pressure readings to be available during provider visit.  Discussed with provider that client has been consistent in her monitoring and her blood pressures have been on target. Client has incorporated lifestyle changes in regards to controlling her blood pressure as well as her diabetes.  0900 Communicated with client this am by text message to congratulate her on her blood pressure readings and her dedication to the program and working to make positive lifestyle changes with exercise and diet. Plan to follow up with client after her medical provider visit to discuss her visit and results given by her provider regarding her most recent lab work.Francee Nodal RN  [facility] [employer] [facility]

## 2020-02-11 NOTE — Patient Instructions (Signed)
Plantar Fasciitis  Plantar fasciitis is a painful foot condition that affects the heel. It occurs when the band of tissue that connects the toes to the heel bone (plantar fascia) becomes irritated. This can happen as the result of exercising too much or doing other repetitive activities (overuse injury). The pain from plantar fasciitis can range from mild irritation to severe pain that makes it difficult to walk or move. The pain is usually worse in the morning after sleeping, or after sitting or lying down for a while. Pain may also be worse after long periods of walking or standing. What are the causes? This condition may be caused by:  Standing for long periods of time.  Wearing shoes that do not have good arch support.  Doing activities that put stress on joints (high-impact activities), including running, aerobics, and ballet.  Being overweight.  An abnormal way of walking (gait).  Tight muscles in the back of your lower leg (calf).  High arches in your feet.  Starting a new athletic activity. What are the signs or symptoms? The main symptom of this condition is heel pain. Pain may:  Be worse with first steps after a time of rest, especially in the morning after sleeping or after you have been sitting or lying down for a while.  Be worse after long periods of standing still.  Decrease after 30-45 minutes of activity, such as gentle walking. How is this diagnosed? This condition may be diagnosed based on your medical history and your symptoms. Your health care provider may ask questions about your activity level. Your health care provider will do a physical exam to check for:  A tender area on the bottom of your foot.  A high arch in your foot.  Pain when you move your foot.  Difficulty moving your foot. You may have imaging tests to confirm the diagnosis, such as:  X-rays.  Ultrasound.  MRI. How is this treated? Treatment for plantar fasciitis depends on how  severe your condition is. Treatment may include:  Rest, ice, applying pressure (compression), and raising the affected foot (elevation). This may be called RICE therapy. Your health care provider may recommend RICE therapy along with over-the-counter pain medicines to manage your pain.  Exercises to stretch your calves and your plantar fascia.  A splint that holds your foot in a stretched, upward position while you sleep (night splint).  Physical therapy to relieve symptoms and prevent problems in the future.  Injections of steroid medicine (cortisone) to relieve pain and inflammation.  Stimulating your plantar fascia with electrical impulses (extracorporeal shock wave therapy). This is usually the last treatment option before surgery.  Surgery, if other treatments have not worked after 12 months. Follow these instructions at home:  Managing pain, stiffness, and swelling  If directed, put ice on the painful area: ? Put ice in a plastic bag, or use a frozen bottle of water. ? Place a towel between your skin and the bag or bottle. ? Roll the bottom of your foot over the bag or bottle. ? Do this for 20 minutes, 2-3 times a day.  Wear athletic shoes that have air-sole or gel-sole cushions, or try wearing soft shoe inserts that are designed for plantar fasciitis.  Raise (elevate) your foot above the level of your heart while you are sitting or lying down. Activity  Avoid activities that cause pain. Ask your health care provider what activities are safe for you.  Do physical therapy exercises and stretches as told   by your health care provider.  Try activities and forms of exercise that are easier on your joints (low-impact). Examples include swimming, water aerobics, and biking. General instructions  Take over-the-counter and prescription medicines only as told by your health care provider.  Wear a night splint while sleeping, if told by your health care provider. Loosen the splint  if your toes tingle, become numb, or turn cold and blue.  Maintain a healthy weight, or work with your health care provider to lose weight as needed.  Keep all follow-up visits as told by your health care provider. This is important. Contact a health care provider if you:  Have symptoms that do not go away after caring for yourself at home.  Have pain that gets worse.  Have pain that affects your ability to move or do your daily activities. Summary  Plantar fasciitis is a painful foot condition that affects the heel. It occurs when the band of tissue that connects the toes to the heel bone (plantar fascia) becomes irritated.  The main symptom of this condition is heel pain that may be worse after exercising too much or standing still for a long time.  Treatment varies, but it usually starts with rest, ice, compression, and elevation (RICE therapy) and over-the-counter medicines to manage pain. This information is not intended to replace advice given to you by your health care provider. Make sure you discuss any questions you have with your health care provider. Document Revised: 06/02/2017 Document Reviewed: 04/17/2017 Elsevier Patient Education  2020 [employer] Inc.  

## 2020-02-11 NOTE — Progress Notes (Signed)
BP 130/80   Pulse 97   Temp 97.7 F (36.5 C)   Ht 5' 0.25" (1.53 m)   Wt 190 lb 4 oz (86.3 kg)   SpO2 97%   BMI 36.85 kg/m    Subjective:    Patient ID: Ashley Chase, female    DOB: 1960-08-23, 59 y.o.   MRN: [id number]  HPI: Ashley Chase is a 59 y.o. female presenting on 02/11/2020 for Diabetes, Hypertension, Thyroid Problem, and Hyperlipidemia   HPI  Pt had a negaive covid 19 screening questionnaire.    pt is 58yoF with DM, htn, dyslipidemia, hypothyroidism and anxiety.   She is still working as a Scientist, research (physical sciences).    She is Doing well and has No complaints except for bilateral heel pain.  BP 115/72 avg High 131/79  Low 13/70   Relevant past medical, surgical, family and social history reviewed and updated as indicated. Interim medical history since our last visit reviewed. Allergies and medications reviewed and updated.   Current Outpatient Medications:  .  amLODipine (NORVASC) 5 MG tablet, Take 1 tablet (5 mg total) by mouth daily., Disp: 90 tablet, Rfl: 0 .  atorvastatin (LIPITOR) 40 MG tablet, Take 1 tablet (40 mg total) by mouth daily., Disp: 90 tablet, Rfl: 1 .  citalopram (CELEXA) 40 MG tablet, Take 1 tablet (40 mg total) by mouth daily., Disp: 30 tablet, Rfl: 3 .  diclofenac (VOLTAREN) 75 MG EC tablet, Take 1 tablet (75 mg total) by mouth 2 (two) times daily as needed., Disp: 30 tablet, Rfl: 0 .  Insulin Glargine (LANTUS SOLOSTAR) 100 UNIT/ML Solostar Pen, Inject 40 Units into the skin at bedtime. (Patient taking differently: Inject 50 Units into the skin at bedtime. ), Disp: 5 pen, Rfl: PRN .  levothyroxine (SYNTHROID) 25 MCG tablet, TAKE 1 Tablet BY MOUTH ONCE DAILY BEFORE BREAKFAST, Disp: 90 tablet, Rfl: 1 .  lisinopril-hydrochlorothiazide (ZESTORETIC) 20-25 MG tablet, Take 1 tablet by mouth daily., Disp: 90 tablet, Rfl: 1 .  loratadine (CLARITIN) 10 MG tablet, Take 10 mg by mouth daily as needed for allergies., Disp: , Rfl:  .  metFORMIN (GLUCOPHAGE) 500 MG  tablet, TAKE 1 Tablet  BY MOUTH TWICE DAILY WITH A MEAL, Disp: 180 tablet, Rfl: 1 .  Omega-3 Fatty Acids (FISH OIL) 1000 MG CAPS, 1 po qd, Disp: , Rfl: 0 .  sitaGLIPtin (JANUVIA) 100 MG tablet, Take 1 tablet (100 mg total) by mouth daily., Disp: 90 tablet, Rfl: 1     Review of Systems  Per HPI unless specifically indicated above     Objective:    BP 130/80   Pulse 97   Temp 97.7 F (36.5 C)   Ht 5' 0.25" (1.53 m)   Wt 190 lb 4 oz (86.3 kg)   SpO2 97%   BMI 36.85 kg/m   Wt Readings from Last 3 Encounters:  02/11/20 190 lb 4 oz (86.3 kg)  12/04/19 198 lb 12.8 oz (90.2 kg)  11/26/19 192 lb 3.2 oz (87.2 kg)    Physical Exam Vitals reviewed.  Constitutional:      Appearance: She is well-developed.  HENT:     Head: Normocephalic and atraumatic.  Cardiovascular:     Rate and Rhythm: Normal rate and regular rhythm.  Pulmonary:     Effort: Pulmonary effort is normal.     Breath sounds: Normal breath sounds.  Abdominal:     General: Bowel sounds are normal.     Palpations: Abdomen is soft. There is no mass.  Tenderness: There is no abdominal tenderness.  Musculoskeletal:     Cervical back: Neck supple.     Right lower leg: No edema.     Left lower leg: No edema.     Right foot: Normal range of motion.     Left foot: Normal range of motion.  Feet:     Right foot:     Skin integrity: Skin integrity normal.     Left foot:     Skin integrity: Skin integrity normal.     Comments: See DM foot exm + tenderness over heel on plantar surface bilaterally Lymphadenopathy:     Cervical: No cervical adenopathy.  Skin:    General: Skin is warm and dry.  Neurological:     Mental Status: She is alert and oriented to person, place, and time.  Psychiatric:        Mood and Affect: Mood normal.        Behavior: Behavior normal.     Results for orders placed or performed during the hospital encounter of 02/10/20  TSH  Result Value Ref Range   TSH 2.986 0.350 - 4.500 uIU/mL   Hemoglobin A1c  Result Value Ref Range   Hgb A1c MFr Bld 7.3 (H) 4.8 - 5.6 %   Mean Plasma Glucose 162.81 mg/dL  Microalbumin, urine  Result Value Ref Range   Microalb, Ur 11.1 (H) Not Estab. ug/mL  Lipid panel  Result Value Ref Range   Cholesterol 156 0 - 200 mg/dL   Triglycerides 80 <250 mg/dL   HDL 38 (L) >03 mg/dL   Total CHOL/HDL Ratio 4.1 RATIO   VLDL 16 0 - 40 mg/dL   LDL Cholesterol 704 (H) 0 - 99 mg/dL  Comprehensive metabolic panel  Result Value Ref Range   Sodium 140 135 - 145 mmol/L   Potassium 3.6 3.5 - 5.1 mmol/L   Chloride 101 98 - 111 mmol/L   CO2 28 22 - 32 mmol/L   Glucose, Bld 110 (H) 70 - 99 mg/dL   BUN 16 6 - 20 mg/dL   Creatinine, Ser 8.88 0.44 - 1.00 mg/dL   Calcium 8.8 (L) 8.9 - 10.3 mg/dL   Total Protein 7.2 6.5 - 8.1 g/dL   Albumin 4.0 3.5 - 5.0 g/dL   AST 17 15 - 41 U/L   ALT 16 0 - 44 U/L   Alkaline Phosphatase 99 38 - 126 U/L   Total Bilirubin 0.6 0.3 - 1.2 mg/dL   GFR calc non Af Amer >60 >60 mL/min   GFR calc Af Amer >60 >60 mL/min   Anion gap 11 5 - 15      Assessment & Plan:    Encounter Diagnoses  Name Primary?  . Controlled diabetes mellitus type 2 with complications, unspecified whether long term insulin use (HCC) Yes  . Essential hypertension   . Hyperlipidemia, unspecified hyperlipidemia type   . Hypothyroidism, unspecified type   . Anxiety   . Obesity, unspecified classification, unspecified obesity type, unspecified whether serious comorbidity present   . Plantar fasciitis   . Encounter for screening for malignant neoplasm of breast, unspecified screening modality        -reviewed labs with pt -pt to continue current medications -counseled on plantar fasciits including wearing good support, avoiding bare-feet, exercises, icing the feet (10-20 minutes 2 or 3 times/day) -refer for screening Mammogram -tp encouraged to Return iFOBT to office for colon cancer screening -pt encouraged to get covid vaccination -pt to  follow up 3  months.  She is to contact office sooner prn

## 2020-02-20 ENCOUNTER — Telehealth: Payer: Self-pay

## 2020-02-20 NOTE — Telephone Encounter (Signed)
Ptwas f/u with phone call for weekly update on today (01/21/20)  to address any challenges or successes of BP and Weight checks and Personal Action Plan. Pt states she is doing wellregarding the use of her bp and scale equipment and denies any problems with use.   Personal Action Plan Timeline(ALL Unchanged) -Pt willcheck BP twice daily6-6:30amand5-5:30pm(ON TARGET) -Pt will monitor her weight  once (1) daily withoutclothingin the morning(ON TARGET) -Pt will increase herphysicalactivityto 15 mins/1 x day for 6days(ON TARGET) averaging about 8,000-10,000 steps daily) -Pt willincreaseher activityto consist of moving more through light housekeeping(ON TARGET) ( continues to be active through lighthouskeeping at home/ work) went out walking and window shop this past week and continues to walk around the parameters of her yard about 15 mins/daily most days out of 7days) -Pt will commit to using her health app on her phone to monitor the amount of activity she is doing.(ON TARGET) -Pt will continue to f/u with provider as recommended and continue medications as prescribed(ON TARGET) -Pt will following Healthy Eatingplan- (ON TARGET/unchanged)states she eats baked foods, chicken breast, salads,spinanch, peach, mandarian orange, strawberries, apples, 1/2 banana, blueberries and do do not eat after 6pm  SUCCESSES - BP goals are more better than goal (135/85)   BARRIERS NONE  Action Plan  -Continue same goals and plan as previous weeks

## 2020-02-20 NOTE — Telephone Encounter (Addendum)
Ptwas f/u byphonecallfor weekly update to address any challenges or successes of BP and Weight checks and Personal Action Plan. Pt states she is doing wellregarding the use of her bp and scale equipment and denies any problems with use.   Personal Action Plan Timeline(ALL Unchanged) -Pt willcheck BP twice daily6-6:30amand5-5:30pm(ON TARGET) -Pt will monitor her weight  once (1) daily withoutclothingin the morning(ON TARGET) -Pt will increase herphysicalactivityto 15 mins/1 x day for 6days(ON TARGET) averaging about 8,000-10,000 steps daily) -Pt willincreaseher activityto consist of moving more through light housekeeping(ON TARGET) ( continues to be active through lighthouskeeping at home/ work) went out walking and window shop this past week and continues to walk around the parameters of her yard about 15 mins/daily most days out of 7days) -Pt will commit to using her health app on her phone to monitor the amount of activity she is doing.(ON TARGET) -Pt will continue to f/u with provider as recommended and continue medications as prescribed(ON TARGET) -Pt will following Healthy Eatingplan- (ON TARGET/unchanged)states she eats baked foods, chicken breast, salads,spinanch, peach, mandarian orange, strawberries, apples, 1/2 banana, blueberries and do do not eat after 6pm  SUCCESSES - BP goals are more better than goal (135/85)   BARRIERS NONE  Action Plan  -Continue same goals and plan as previous weeks

## 2020-02-20 NOTE — Telephone Encounter (Addendum)
Ptwas f/u with phone call for weekly update on today (01/15/20) to address any challenges or successes of BP and Weight checks and Personal Action Plan. Pt states she is doing wellregarding the use of her bp and scale equipment and denies any problems with use.   Personal Action Plan Timeline(ALL Unchanged) -Pt willcheck BP twice daily6-6:30amand5-5:30pm(ON TARGET) -Pt will monitor her weight  once (1) daily withoutclothingin the morning(ON TARGET) -Pt will increase herphysicalactivityto 15 mins/1 x day for 6days(ON TARGET) averaging about 8,000-10,000 steps daily) -Pt willincreaseher activityto consist of moving more through light housekeeping(ON TARGET) ( continues to be active through lighthouskeeping at home/ work) went out walking and window shop this past week and continues to walk around the parameters of her yard about 15 mins/daily most days out of 7days) -Pt will commit to using her health app on her phone to monitor the amount of activity she is doing.(ON TARGET) -Pt will continue to f/u with provider as recommended and continue medications as prescribed(ON TARGET) -Pt will following Healthy Eatingplan- (ON TARGET/unchanged)states she eats baked foods, chicken breast, salads,spinanch, peach, mandarian orange, strawberries, apples, 1/2 banana, blueberries and do do not eat after 6pm  SUCCESSES - BP goals are more better than goal (135/85)   BARRIERS NONE  Action Plan  -Continue same goals and plan as previous weeks

## 2020-02-21 NOTE — Congregational Nurse Program (Signed)
Derenda Mis remote patient monitoring 7 day averages of reported blood pressure readings. This program is in collaboration with [facility] [place] [facility]. Client is consistently meeting goals of monitoring, medication adherence and incorporation of lifestyle changes in diet, exercise and stress reduction.  Last 7 day averages of recorded blood pressures:  Last 7 day average of recorded blood pressure readings: 115/73 Last 7 day average of highest recorded reading: 120/75 Last 7 day average of lowest recorded readings: 97/65   Plan:  RN will continue to monitor daily blood pressure readings on the platform during clinic hours to note any escalations or trends that would require intervention per program guidelines.  Client will continue to receive weekly health coaching sessions with Maia Plan LPN to modify or update personal action plan.  RN will continue to meet and discuss client's progress weekly and discuss any potential barriers to meeting goals and solutions for any barriers.   Francee Nodal RN  [facility]

## 2020-02-28 NOTE — Congregational Nurse Program (Signed)
Derenda Mis remote patient monitoring program for hypertension in [facility] last 7 day recorded blood pressure monitoring averages. Client is consistently monitoring per program guidelines and is working toward increasing activity based on health coaching with Maia Plan LPN. She is working on her diet to help her Diabetes as well as hypertension.   Last 7 day averages from recorded readings to welch allyn dashboard:  Last 7 day average recorded blood pressure: 113/71 Last 7 day highest reading recorded: 117/71 Lowest 7 day reading recorded : 107/67   Plan: RN will continue to monitor the dashboard daily during clinic hours per program guidelines for escalations or trends that would require intervention.  Client will continue with ongoing health coaching with Maia Plan LPN for support and changes to personal action plan.  RN will continue to meet weekly with W. Crowder LPN to discuss client's progress and address any potential barriers to client meeting goals.  Francee Nodal RN  [facility] [employer] [facility]

## 2020-03-05 ENCOUNTER — Telehealth: Payer: Self-pay

## 2020-03-05 NOTE — Telephone Encounter (Signed)
Conversation notes recorded on 03/02/2020  Ptwas f/u byphonecallfor weekly update to address any challenges or successes of BP and Weight checks and Personal Action Plan. Pt states she continues to do wellregarding the use of her bp and scale equipment and denies any problems with use.   Personal Action Plan Timeline(CONTINUES TO BE ALL Unchanged)  SAME ROUTINE -Pt willcheck BP twice daily6-6:30amand5-5:30pm(ON TARGET)  TIMES SOMETIMES DIFFER WHEN CHECKING -Pt will monitor her weight  once (1) daily withoutclothingin the morning(ON TARGET) -Pt will increase herphysicalactivityto 15 mins/1 x day for 6days(ON TARGET) averaging about 8,000-10,000 steps daily) -Pt willincreaseher activityto consist of moving more through light housekeeping(ON TARGET) (continues to be active through lighthouskeeping at home/ work) went out walking and window shop this past week and continues to walk around the parameters of her yard about 15 mins/daily most days out of 7days) -Pt will commit to using her health app on her phone to monitor the amount of activity she is doing.(ON TARGET) -Pt will continue to f/u with provider as recommended and continue medications as prescribed(ON TARGET) -Pt will following Healthy Eatingplan- (ON TARGET/unchanged)states she eats baked foods, chicken breast, salads,spinanch, peach, mandarian orange, strawberries, apples, 1/2 banana, blueberries and do do not eat after 6pm  SUCCESSES -Continues with taking meds on time  -Found a new exercise venue (park in [place]) _Stress level has decreased since passing of dog/less responsbility to caretake _Blood sugar has decreased along with BP _Continue to maintain 10,000 step a day   BARRIERS -Fluctuation weight/Not losing weight as she wants  Action Plan  -Watch and be aware of reasons behind weight fluctations  -Will add more fiber to diet to help with constipation and possible weight  fluctations  Will f/u on 03/10/20

## 2020-03-05 NOTE — Telephone Encounter (Addendum)
ATTEMPTED TO REACH BY PHONE ON 8/26//21 BUT UNSUCCESFUL.   WILL F/U ON 03/02/20

## 2020-03-16 ENCOUNTER — Ambulatory Visit (HOSPITAL_COMMUNITY)
Admission: RE | Admit: 2020-03-16 | Discharge: 2020-03-16 | Disposition: A | Discharge status: Home / Self Care | Payer: Self-pay | Source: Ambulatory Visit | Attending: Physician Assistant | Admitting: Physician Assistant

## 2020-03-16 ENCOUNTER — Telehealth: Payer: Self-pay

## 2020-03-16 ENCOUNTER — Other Ambulatory Visit: Payer: Self-pay

## 2020-03-16 DIAGNOSIS — Z1231 Encounter for screening mammogram for malignant neoplasm of breast: Secondary | ICD-10-CM | POA: Insufficient documentation

## 2020-03-16 NOTE — Telephone Encounter (Signed)
Pt was followed up by text message to check-in to see if any questions needed to be addressed regarding BP and weight equipment and bp management in addition to a reminder about new goals for the week.  (see previous note for goals)

## 2020-03-16 NOTE — Telephone Encounter (Signed)
Ptwas f/u byphonecallfor weekly update to address any challenges or successes of BP and Weight checks and Personal Action Plan.  Pt states she not been able to go to the new venue to walk (park in [place]) as of yet as planned, due to the weather and it being a little hotter than she expected, but does plan to go    SUCCESSES -Continues with taking meds on time  _Stress level continues to be low -Added a new physical activity-gardening/cutting wood; along with continous movement throughout the day of lighthousekeeping at work and home  -Maintaining avg of 10,000 steps a day recoreded by fitness watch  -Completed about 18 hours of physical activity for the last week -Continues to be on target with eating habits of being inclusive of healthy cooking(baking); healthy eating (fruits/vegetables- spinanch, green beans, fresh steamed broccoli) -Was able to add more fiber to diet to help with constipation and weight fluctuations. States she add plums to her diet which help her constipation level   BARRIERS -Too much warm weather, to exercise outdoors   Action Plan  -Anticipated increasing walking activity back to 5 day a week = 2-3 hours  _Add new venue for walking (Park in [place])  -Complete the gardening project with son as a apart of additional physical activity level -Starting this week and bc of consistency over the past 3 months, start checking BP 1 time a week (1 set of paired readings) and can choose the time of day for the NEXT 3 MONTHS.     Will f/u on 03/16/20

## 2020-03-19 ENCOUNTER — Other Ambulatory Visit: Payer: Self-pay | Admitting: Physician Assistant

## 2020-04-02 NOTE — Congregational Nurse Program (Signed)
Derenda Mis remote monitoring program 7 day averages for hypertension in collaboration with [facility]. Client is consistently monitory and making diet and lifestye changes. See Maia Plan LPN notes for updated personal action plans and goals.  Will provide last 7 day averages along with last 30 day averages for provider comparison.   Last 7 day blood pressure recordings average: 110/70 target goal. Last 7 day highest blood pressure recorded: 116/71 Last 7 day lowest blood pressure recorded: 103/69   Last 30 day average of blood pressure recorded: 112/69 Last 30 day highest blood pressure recorded: 131/68 Last 30 day lowest blood pressure recorded: 97/65  Plan:  RN will continue to monitor dashboard daily for blood pressure recordings during scheduled office hours per program guidelines to note any escalations or trends requiring intervention.  Maia Plan LPN will continue to provide ongoing support and health coaching, assisting client to update personal goals and action plan.  RN and Chrisandra Netters LPN will continue to meet and discuss weekly client's progress along with any potential barriers to client meeting program goals and develop plan of action/solutions for any identified barriers.  Francee Nodal RN

## 2020-04-06 ENCOUNTER — Telehealth: Payer: Self-pay

## 2020-04-06 NOTE — Telephone Encounter (Signed)
Ptwas f/u byphonecallfor weekly update to address any challenges or successes of BP and Weight checks and Personal Action Plan. Pt denies any stress or smoking habits  SUCCESSES -Continues with taking meds on time  -Continue to monitor BP and weight daily with no problems with equipment -Continues to maintain healthy eating plan to be inclusive of healthy cooking(baking); eating (fresh fruits/ mostly frozen vegetables) limits sodium intake with use of more salt substitutes (pepper, paprika)   BARRIERS -Lack of Motivation to Exercise compared to before (Just does not like walking) -No Longer able to walk her dogs, due to them being deceased   Action Plan  -Re start new walking plan to walk at a local park on this week to test her endurance and amount of time she can get in -Increase motivation by finding a walking partner to walk with her at least 2 day a week -Recharge up fitness watch to be prepared for use with walk and other work or home related activities.   Will f/u on 04/09/20

## 2020-04-14 NOTE — Congregational Nurse Program (Signed)
Ashley Chase remote patient monitoring for hypertension in [facility] with [facility]. Due to consistent adherence and dedication to monitoring, lifestye changes with diet and exercise and consistently being on target with blood pressure goals, client is now monitoring once weekly and pair of readings.  Will now provide a 30 day average to give provider a best snapshot of client's progress.   Last 30 day average based on recorded blood pressure readings on [facility]ch al[facility] dashboard.  Last 30 day average from recorded blood pressure readings: 116/72 Last 30 day highest blood pressure reading recorded: 123/76 Last 30 day lowest blood pressure reading recorded: 110/64   Plan: RN will continue to monitor blood pressure readings weekly during office hours on welch al[facility] dashboard to note any escalations or trends that would require intervention and or notification to provider.  LPN will continue to offer support and health coaching weekly to client.  RN and LPN will continue to discuss weekly client's progress and any potential barriers to meeting goals and develop an action plan for any identified barriers.  RN will continue to follow client's labs as she is type 2 DM. Last a1c 7.3 02/10/20 down from 8.3 5 months prior.  Francee Nodal RN [employer]

## 2020-04-15 ENCOUNTER — Encounter: Payer: Self-pay | Admitting: Physician Assistant

## 2020-04-15 ENCOUNTER — Ambulatory Visit: Payer: Self-pay | Admitting: Physician Assistant

## 2020-04-15 DIAGNOSIS — K0889 Other specified disorders of teeth and supporting structures: Secondary | ICD-10-CM

## 2020-04-15 DIAGNOSIS — K0510 Chronic gingivitis, plaque induced: Secondary | ICD-10-CM

## 2020-04-15 MED ORDER — CHLORHEXIDINE GLUCONATE 0.12 % MT SOLN
15.0000 mL | Freq: Two times a day (BID) | OROMUCOSAL | 0 refills | Status: DC
Start: 2020-04-15 — End: 2020-08-17

## 2020-04-15 MED ORDER — AMOXICILLIN 500 MG PO CAPS
500.0000 mg | ORAL_CAPSULE | Freq: Three times a day (TID) | ORAL | 0 refills | Status: AC
Start: 2020-04-15 — End: 2020-04-22

## 2020-04-15 NOTE — Progress Notes (Signed)
   There were no vitals taken for this visit.   Subjective:    Patient ID: [name], female    DOB: June 06, 1961, 59 y.o.   MRN: [id number]  HPI: Latesia Norrington is a 59 y.o. female presenting on 04/15/2020 for No chief complaint on file.   HPI   This is a telemedicine appointment due to coronavirus epidemic.  It is via Telephone because she can't get her video to work today.   I connected with  [name] on 04/15/20 by a video enabled telemedicine application and verified that I am speaking with the correct person using two identifiers.   I discussed the limitations of evaluation and management by telemedicine. The patient expressed understanding and agreed to proceed.  Pt is at work (she works as a Engineer, structural).  Provider is at office.    Pt appointment today for Tooth pain.  She says it's been hurting A few days.  She says the Tooth pain is not bad.  She says Mostly it's her gums.   The Gums are hurting and swollen.  She is Using salt water.  Gums only bleed when brushing and flossing.    Relevant past medical, surgical, family and social history reviewed and updated as indicated. Interim medical history since our last visit reviewed. Allergies and medications reviewed and updated.  Review of Systems  Per HPI unless specifically indicated above     Objective:    There were no vitals taken for this visit.  Wt Readings from Last 3 Encounters:  02/11/20 190 lb 4 oz (86.3 kg)  12/04/19 198 lb 12.8 oz (90.2 kg)  11/26/19 192 lb 3.2 oz (87.2 kg)    Physical Exam Pulmonary:     Effort: No respiratory distress.  Neurological:     Mental Status: She is alert and oriented to person, place, and time.  Psychiatric:        Speech: Speech normal.           Assessment & Plan:   Encounter Diagnoses  Name Primary?  Norva Riffle Yes  . Gingivitis     rx amoxil And chlrohexadine- Pt is put on dental list She has follow up appoitntment later this month

## 2020-04-16 NOTE — Congregational Nurse Program (Signed)
F/u with patient in person on 04/15/20 to conduct weekly update to address any challenges or successes of BP and Weight checks and Personal Action Plan. Pt denies any stress or smoking habits; and/or any issues with using the equipment and or taking her BP meds on time.     Met with patient at local walking trail ([place]) to provide her with support and a walking partner to increase her goal and success of weight loss  SUCCESSES (NO changes from previous weekly update)  -Continues with taking meds on time  -Continue to monitor BP and weight daily with no problems with equipment -Continues to maintain healthy eating plan to be inclusive of healthy cooking(baking); eating (fresh fruits/ mostly frozen vegetables) limits sodium intake with use of more salt substitutes (pepper, paprika)   BARRIERS -Lack of motivation intermittently tends to get in the way  -Weight increased by 1-2 pds due to not exercising within the past week as she  has done in the previous weeks   Action Plan  - Create with Health Coach a walking plan schedule for the next week (04/20/20 to walk)  Mondays and Weds ([place]) will be the starting locations for start of a new walking club   Will f/u on 04/20/20

## 2020-04-22 ENCOUNTER — Other Ambulatory Visit: Payer: Self-pay | Admitting: Physician Assistant

## 2020-04-29 ENCOUNTER — Other Ambulatory Visit: Payer: Self-pay | Admitting: Physician Assistant

## 2020-04-29 DIAGNOSIS — I10 Essential (primary) hypertension: Secondary | ICD-10-CM

## 2020-04-29 DIAGNOSIS — E785 Hyperlipidemia, unspecified: Secondary | ICD-10-CM

## 2020-04-29 DIAGNOSIS — E118 Type 2 diabetes mellitus with unspecified complications: Secondary | ICD-10-CM

## 2020-05-05 ENCOUNTER — Other Ambulatory Visit: Payer: Self-pay | Admitting: Physician Assistant

## 2020-05-05 ENCOUNTER — Telehealth: Payer: Self-pay | Admitting: Physician Assistant

## 2020-05-05 DIAGNOSIS — R300 Dysuria: Secondary | ICD-10-CM

## 2020-05-05 NOTE — Telephone Encounter (Signed)
Pt called reporting urinary frequency and burning that started Friday.  Pt was offered an appointment today to be seen for this but she declined.  She says she would rather just have a UA done with her labs that she is planning to have done in the morning tomorrow.  UA will be added to her lab orders.  She has routine appointment in the office next week.

## 2020-05-06 ENCOUNTER — Other Ambulatory Visit: Payer: Self-pay

## 2020-05-06 ENCOUNTER — Other Ambulatory Visit: Payer: Self-pay | Admitting: Physician Assistant

## 2020-05-06 ENCOUNTER — Other Ambulatory Visit (HOSPITAL_COMMUNITY)
Admission: RE | Admit: 2020-05-06 | Discharge: 2020-05-06 | Disposition: A | Discharge status: Home / Self Care | Payer: Self-pay | Source: Ambulatory Visit | Attending: Physician Assistant | Admitting: Physician Assistant

## 2020-05-06 DIAGNOSIS — R300 Dysuria: Secondary | ICD-10-CM | POA: Insufficient documentation

## 2020-05-06 DIAGNOSIS — E785 Hyperlipidemia, unspecified: Secondary | ICD-10-CM | POA: Insufficient documentation

## 2020-05-06 DIAGNOSIS — I10 Essential (primary) hypertension: Secondary | ICD-10-CM | POA: Insufficient documentation

## 2020-05-06 DIAGNOSIS — E118 Type 2 diabetes mellitus with unspecified complications: Secondary | ICD-10-CM | POA: Insufficient documentation

## 2020-05-06 LAB — LIPID PANEL
Cholesterol: 138 mg/dL (ref 0–200)
HDL: 38 mg/dL — ABNORMAL LOW (ref >40)
LDL Cholesterol: 80 mg/dL (ref 0–99)
Total CHOL/HDL Ratio: 3.6 RATIO
Triglycerides: 101 mg/dL (ref <150)
VLDL: 20 mg/dL (ref 0–40)

## 2020-05-06 LAB — COMPREHENSIVE METABOLIC PANEL
ALT: 16 U/L (ref 0–44)
AST: 14 U/L — ABNORMAL LOW (ref 15–41)
Albumin: 3.8 g/dL (ref 3.5–5.0)
Alkaline Phosphatase: 99 U/L (ref 38–126)
Anion gap: 10 (ref 5–15)
BUN: 15 mg/dL (ref 6–20)
CO2: 29 mmol/L (ref 22–32)
Calcium: 8.9 mg/dL (ref 8.9–10.3)
Chloride: 98 mmol/L (ref 98–111)
Creatinine, Ser: 0.57 mg/dL (ref 0.44–1.00)
GFR, Estimated: >60 mL/min (ref >60)
Glucose, Bld: 124 mg/dL — ABNORMAL HIGH (ref 70–99)
Potassium: 3.5 mmol/L (ref 3.5–5.1)
Sodium: 137 mmol/L (ref 135–145)
Total Bilirubin: 0.5 mg/dL (ref 0.3–1.2)
Total Protein: 7.2 g/dL (ref 6.5–8.1)

## 2020-05-06 LAB — URINALYSIS, ROUTINE W REFLEX MICROSCOPIC
Bacteria, UA: NONE SEEN
Bilirubin Urine: NEGATIVE
Glucose, UA: NEGATIVE mg/dL
Ketones, ur: NEGATIVE mg/dL
Nitrite: NEGATIVE
Protein, ur: NEGATIVE mg/dL
Specific Gravity, Urine: 1.011 (ref 1.005–1.030)
WBC, UA: >50 WBC/hpf — ABNORMAL HIGH (ref 0–5)
pH: 5 (ref 5.0–8.0)

## 2020-05-06 LAB — HEMOGLOBIN A1C
Hgb A1c MFr Bld: 7.3 % — ABNORMAL HIGH (ref 4.8–5.6)
Mean Plasma Glucose: 162.81 mg/dL

## 2020-05-06 MED ORDER — CEPHALEXIN 500 MG PO CAPS
500.0000 mg | ORAL_CAPSULE | Freq: Four times a day (QID) | ORAL | 0 refills | Status: AC
Start: 2020-05-06 — End: 2020-05-13

## 2020-05-11 NOTE — Congregational Nurse Program (Signed)
Followed up with patient to conduct weekly update for the remote monitoring  Hypertension program in person and to walk with patient as a form of support to encourage walking activity as part of her goals   Successes -Continue to maintain healthy eating habits, but have added yogurt as a part of diet  -Continues to increase dedication to walkiing more averaging  between 10,000 and 12,000 steps week = 21/2 hours or more a week of activity (requirement of DASH diet)  -Continues to manage stress level more compared to early stages of entering the program   Barriers None at this time  ACTION PLAN Continue to maintain the sucesses stated above and monitor BP, and take meds as directed by PCP

## 2020-05-13 ENCOUNTER — Encounter: Payer: Self-pay | Admitting: Physician Assistant

## 2020-05-13 ENCOUNTER — Ambulatory Visit: Payer: Self-pay | Admitting: Physician Assistant

## 2020-05-13 VITALS — BP 122/76 | HR 86 | Temp 98.1°F | Ht 60.25 in | Wt 189.8 lb

## 2020-05-13 DIAGNOSIS — E785 Hyperlipidemia, unspecified: Secondary | ICD-10-CM

## 2020-05-13 DIAGNOSIS — E118 Type 2 diabetes mellitus with unspecified complications: Secondary | ICD-10-CM

## 2020-05-13 DIAGNOSIS — I10 Essential (primary) hypertension: Secondary | ICD-10-CM

## 2020-05-13 DIAGNOSIS — F419 Anxiety disorder, unspecified: Secondary | ICD-10-CM

## 2020-05-13 DIAGNOSIS — E039 Hypothyroidism, unspecified: Secondary | ICD-10-CM

## 2020-05-13 DIAGNOSIS — E669 Obesity, unspecified: Secondary | ICD-10-CM

## 2020-05-13 MED ORDER — LANTUS SOLOSTAR 100 UNIT/ML ~~LOC~~ SOPN
50.0000 [IU] | PEN_INJECTOR | Freq: Every day | SUBCUTANEOUS | 0 refills | Status: DC
Start: 2020-05-13 — End: 2022-08-16

## 2020-05-13 NOTE — Progress Notes (Signed)
BP 122/76    Pulse 86    Temp 98.1 F (36.7 C)    Ht 5' 0.25" (1.53 m)    Wt 189 lb 12.8 oz (86.1 kg)    SpO2 96%    BMI 36.76 kg/m    Subjective:    Patient ID: Ashley Chase, female    DOB: 22-Aug-1960, 59 y.o.   MRN: [id number]  HPI: Ashley Chase is a 59 y.o. female presenting on 05/13/2020 for Diabetes, Hypertension, and Hyperlipidemia   HPI    Pt had a negative  covid 19 screening questionnaire.     Chief Complaint  Patient presents with   Diabetes   Hypertension   Hyperlipidemia    Pt is doing better as far as her urine is concerned.  She has 1 more keflex (for urine) and then will be done.  She is walking more.  She has not gotten vaccinated for covid.  She says she is "leary" of it.  She has no complaints today.      Reviewed bs log.  Most values are fine.  A few in the 80s.  Most around 120 or 130.      Relevant past medical, surgical, family and social history reviewed and updated as indicated. Interim medical history since our last visit reviewed. Allergies and medications reviewed and updated.   Current Outpatient Medications:    amLODipine (NORVASC) 5 MG tablet, TAKE 1 Tablet BY MOUTH ONCE EVERY DAY, Disp: 90 tablet, Rfl: 0   atorvastatin (LIPITOR) 40 MG tablet, Take 1 tablet (40 mg total) by mouth daily., Disp: 90 tablet, Rfl: 1   cephALEXin (KEFLEX) 500 MG capsule, Take 1 capsule (500 mg total) by mouth 4 (four) times daily for 7 days., Disp: 28 capsule, Rfl: 0   chlorhexidine (PERIDEX) 0.12 % solution, Use as directed 15 mLs in the mouth or throat 2 (two) times daily. Swish and spit (Patient taking differently: Use as directed 15 mLs in the mouth or throat 2 (two) times daily as needed. Swish and spit), Disp: 120 mL, Rfl: 0   citalopram (CELEXA) 40 MG tablet, Take 1 tablet by mouth once daily, Disp: 30 tablet, Rfl: 2   diclofenac (VOLTAREN) 75 MG EC tablet, Take 1 tablet by mouth twice daily as needed, Disp: 30 tablet, Rfl: 0   Insulin  Glargine (LANTUS SOLOSTAR) 100 UNIT/ML Solostar Pen, Inject 40 Units into the skin at bedtime. (Patient taking differently: Inject 50 Units into the skin at bedtime. ), Disp: 5 pen, Rfl: PRN   levothyroxine (SYNTHROID) 25 MCG tablet, TAKE 1 Tablet BY MOUTH ONCE DAILY BEFORE BREAKFAST, Disp: 90 tablet, Rfl: 1   lisinopril-hydrochlorothiazide (ZESTORETIC) 20-25 MG tablet, Take 1 tablet by mouth daily., Disp: 90 tablet, Rfl: 1   loratadine (CLARITIN) 10 MG tablet, Take 10 mg by mouth daily as needed for allergies., Disp: , Rfl:    metFORMIN (GLUCOPHAGE) 500 MG tablet, TAKE 1 Tablet  BY MOUTH TWICE DAILY WITH A MEAL, Disp: 180 tablet, Rfl: 1   Omega-3 Fatty Acids (FISH OIL) 1000 MG CAPS, 1 po qd, Disp: , Rfl: 0   sitaGLIPtin (JANUVIA) 100 MG tablet, Take 1 tablet (100 mg total) by mouth daily., Disp: 90 tablet, Rfl: 1    Review of Systems  Per HPI unless specifically indicated above     Objective:    BP 122/76    Pulse 86    Temp 98.1 F (36.7 C)    Ht 5' 0.25" (1.53 m)  Wt 189 lb 12.8 oz (86.1 kg)    SpO2 96%    BMI 36.76 kg/m   Wt Readings from Last 3 Encounters:  05/13/20 189 lb 12.8 oz (86.1 kg)  02/11/20 190 lb 4 oz (86.3 kg)  12/04/19 198 lb 12.8 oz (90.2 kg)    Physical Exam Vitals reviewed.  Constitutional:      General: She is not in acute distress.    Appearance: She is well-developed. She is obese. She is not ill-appearing.  HENT:     Head: Normocephalic and atraumatic.  Cardiovascular:     Rate and Rhythm: Normal rate and regular rhythm.  Pulmonary:     Effort: Pulmonary effort is normal.     Breath sounds: Normal breath sounds.  Abdominal:     General: Bowel sounds are normal.     Palpations: Abdomen is soft. There is no mass.     Tenderness: There is no abdominal tenderness.  Musculoskeletal:     Cervical back: Neck supple.     Right lower leg: No edema.     Left lower leg: No edema.  Lymphadenopathy:     Cervical: No cervical adenopathy.  Skin:     General: Skin is warm and dry.  Neurological:     Mental Status: She is alert and oriented to person, place, and time.  Psychiatric:        Behavior: Behavior normal.     Results for orders placed or performed during the hospital encounter of 05/06/20  Urinalysis, Routine w reflex microscopic  Result Value Ref Range   Color, Urine YELLOW YELLOW   APPearance HAZY (A) CLEAR   Specific Gravity, Urine 1.011 1.005 - 1.030   pH 5.0 5.0 - 8.0   Glucose, UA NEGATIVE NEGATIVE mg/dL   Hgb urine dipstick SMALL (A) NEGATIVE   Bilirubin Urine NEGATIVE NEGATIVE   Ketones, ur NEGATIVE NEGATIVE mg/dL   Protein, ur NEGATIVE NEGATIVE mg/dL   Nitrite NEGATIVE NEGATIVE   Leukocytes,Ua SMALL (A) NEGATIVE   RBC / HPF 0-5 0 - 5 RBC/hpf   WBC, UA >50 (H) 0 - 5 WBC/hpf   Bacteria, UA NONE SEEN NONE SEEN   Squamous Epithelial / LPF 0-5 0 - 5   Mucus PRESENT   Hemoglobin A1c  Result Value Ref Range   Hgb A1c MFr Bld 7.3 (H) 4.8 - 5.6 %   Mean Plasma Glucose 162.81 mg/dL  Lipid panel  Result Value Ref Range   Cholesterol 138 0 - 200 mg/dL   Triglycerides 528 <413 mg/dL   HDL 38 (L) >24 mg/dL   Total CHOL/HDL Ratio 3.6 RATIO   VLDL 20 0 - 40 mg/dL   LDL Cholesterol 80 0 - 99 mg/dL  Comprehensive metabolic panel  Result Value Ref Range   Sodium 137 135 - 145 mmol/L   Potassium 3.5 3.5 - 5.1 mmol/L   Chloride 98 98 - 111 mmol/L   CO2 29 22 - 32 mmol/L   Glucose, Bld 124 (H) 70 - 99 mg/dL   BUN 15 6 - 20 mg/dL   Creatinine, Ser 4.01 0.44 - 1.00 mg/dL   Calcium 8.9 8.9 - 02.7 mg/dL   Total Protein 7.2 6.5 - 8.1 g/dL   Albumin 3.8 3.5 - 5.0 g/dL   AST 14 (L) 15 - 41 U/L   ALT 16 0 - 44 U/L   Alkaline Phosphatase 99 38 - 126 U/L   Total Bilirubin 0.5 0.3 - 1.2 mg/dL   GFR, Estimated >25 >36  mL/min   Anion gap 10 5 - 15      Assessment & Plan:    Encounter Diagnoses  Name Primary?   Controlled diabetes mellitus type 2 with complications, unspecified whether long term insulin use (HCC)  Yes   Essential hypertension    Hyperlipidemia, unspecified hyperlipidemia type    Obesity, unspecified classification, unspecified obesity type, unspecified whether serious comorbidity present    Hypothyroidism, unspecified type    Anxiety      -Counseled on covid vaccination and encouraged her to get it -No changes to medications.  Pt encouraged to continue to watch diabetic diet and monitor blood sugars -pt to follow up 3 months.  She is to contact office sooner prn

## 2020-05-19 ENCOUNTER — Encounter: Payer: Self-pay | Admitting: Physician Assistant

## 2020-05-19 ENCOUNTER — Ambulatory Visit: Payer: Self-pay | Admitting: Physician Assistant

## 2020-05-19 ENCOUNTER — Other Ambulatory Visit: Payer: Self-pay

## 2020-05-19 ENCOUNTER — Ambulatory Visit (HOSPITAL_COMMUNITY)
Admission: RE | Admit: 2020-05-19 | Discharge: 2020-05-19 | Disposition: A | Discharge status: Home / Self Care | Payer: Self-pay | Source: Ambulatory Visit | Attending: Physician Assistant | Admitting: Physician Assistant

## 2020-05-19 VITALS — BP 126/80 | HR 72 | Temp 98.4°F | Ht 60.25 in | Wt 189.4 lb

## 2020-05-19 DIAGNOSIS — M25561 Pain in right knee: Secondary | ICD-10-CM

## 2020-05-19 DIAGNOSIS — S60221A Contusion of right hand, initial encounter: Secondary | ICD-10-CM

## 2020-05-19 DIAGNOSIS — W19XXXA Unspecified fall, initial encounter: Secondary | ICD-10-CM

## 2020-05-19 DIAGNOSIS — S0083XA Contusion of other part of head, initial encounter: Secondary | ICD-10-CM

## 2020-05-19 NOTE — Progress Notes (Signed)
BP 126/80   Pulse 72   Temp 98.4 F (36.9 C)   Ht 5' 0.25" (1.53 m)   Wt 189 lb 6.4 oz (85.9 kg)   SpO2 96%   BMI 36.68 kg/m    Subjective:    Patient ID: Ashley Chase, female    DOB: 09-03-1960, 59 y.o.   MRN: [id number]  HPI: Ashley Chase is a 59 y.o. female presenting on 05/19/2020 for Fall (fell yesterday in [place] parking lot. pt states she was trying to go around a cart and fell on her face and knees. pt states she did not lose consciousness. pt does not think she has any fractures. )   HPI   Pt had a negative covid 19 screening questionnaire.  Chief Complaint  Patient presents with  . Fall    fell yesterday in [place] parking lot. pt states she was trying to go around a cart and fell on her face and knees. pt states she did not lose consciousness. pt does not think she has any fractures.     Pt denies injury to torso.  Pt tripped and fell.   Relevant past medical, surgical, family and social history reviewed and updated as indicated. Interim medical history since our last visit reviewed. Allergies and medications reviewed and updated.   Current Outpatient Medications:  .  amLODipine (NORVASC) 5 MG tablet, TAKE 1 Tablet BY MOUTH ONCE EVERY DAY, Disp: 90 tablet, Rfl: 0 .  atorvastatin (LIPITOR) 40 MG tablet, Take 1 tablet (40 mg total) by mouth daily., Disp: 90 tablet, Rfl: 1 .  chlorhexidine (PERIDEX) 0.12 % solution, Use as directed 15 mLs in the mouth or throat 2 (two) times daily. Swish and spit (Patient taking differently: Use as directed 15 mLs in the mouth or throat 2 (two) times daily as needed. Swish and spit), Disp: 120 mL, Rfl: 0 .  citalopram (CELEXA) 40 MG tablet, Take 1 tablet by mouth once daily, Disp: 30 tablet, Rfl: 2 .  diclofenac (VOLTAREN) 75 MG EC tablet, Take 1 tablet by mouth twice daily as needed, Disp: 30 tablet, Rfl: 0 .  insulin glargine (LANTUS SOLOSTAR) 100 UNIT/ML Solostar Pen, Inject 50 Units into the skin at bedtime., Disp: 1 mL, Rfl:  0 .  levothyroxine (SYNTHROID) 25 MCG tablet, TAKE 1 Tablet BY MOUTH ONCE DAILY BEFORE BREAKFAST, Disp: 90 tablet, Rfl: 1 .  lisinopril-hydrochlorothiazide (ZESTORETIC) 20-25 MG tablet, Take 1 tablet by mouth daily., Disp: 90 tablet, Rfl: 1 .  loratadine (CLARITIN) 10 MG tablet, Take 10 mg by mouth daily as needed for allergies., Disp: , Rfl:  .  metFORMIN (GLUCOPHAGE) 500 MG tablet, TAKE 1 Tablet  BY MOUTH TWICE DAILY WITH A MEAL, Disp: 180 tablet, Rfl: 1 .  Omega-3 Fatty Acids (FISH OIL) 1000 MG CAPS, 1 po qd, Disp: , Rfl: 0 .  sitaGLIPtin (JANUVIA) 100 MG tablet, Take 1 tablet (100 mg total) by mouth daily., Disp: 90 tablet, Rfl: 1    Review of Systems  Per HPI unless specifically indicated above     Objective:    BP 126/80   Pulse 72   Temp 98.4 F (36.9 C)   Ht 5' 0.25" (1.53 m)   Wt 189 lb 6.4 oz (85.9 kg)   SpO2 96%   BMI 36.68 kg/m   Wt Readings from Last 3 Encounters:  05/19/20 189 lb 6.4 oz (85.9 kg)  05/13/20 189 lb 12.8 oz (86.1 kg)  02/11/20 190 lb 4 oz (86.3 kg)  Physical Exam Constitutional:      General: She is not in acute distress. HENT:     Head: Normocephalic.     Comments: Abrasion to nose(see picture).  Breathing normally. Cardiovascular:     Rate and Rhythm: Normal rate and regular rhythm.  Pulmonary:     Effort: Pulmonary effort is normal. No respiratory distress.  Musculoskeletal:     Right knee: No swelling or deformity. Normal range of motion. Tenderness present.     Comments: Pain with ROM  Neurological:     Mental Status: She is alert and oriented to person, place, and time.  Psychiatric:        Attention and Perception: Attention normal.        Speech: Speech normal.        Behavior: Behavior normal. Behavior is cooperative.                 Assessment & Plan:     Encounter Diagnoses  Name Primary?  . Fall, initial encounter Yes  . Acute pain of right knee   . Contusion of right hand, initial encounter   .  Contusion of face, initial encounter       -Send for xray right knee- concern for patella fracture -pt to ice the areas, 10-20 minutes at a time, several times daily as needed.  She can use OTC analgesic like APAP or IBU -pt is given [facility] financial assistance to cover cost of xray.  Will ask [facility] to help patient complete the forms -pt to follow up as scheduled.  She will contact office sooner prn

## 2020-06-10 ENCOUNTER — Telehealth: Payer: Self-pay

## 2020-06-10 NOTE — Telephone Encounter (Signed)
Follow up was completed with patient by phone to conduct weekly update for the remote monitoring hypertension program.     Pt stated she was doing well since returning back from her vacation from [place]. She denies any problems with the BP and Scale equipment and states she have no questions about the use of the equipment.  She feels that overall her health continues to approve and feels well   Successes -Continue to maintain healthy eating habits, although she did splurged with foods, outside  that she during vacation,t was able to keep her meals at small portions. -Continued to monitor her steps daily.  Was able to increase steps while on trip averaging 45,000 to 50,000 -Continues to monitor BP at least once a week per the program requirement    Barriers No Barriers identified at this time    ACTION PLAN -Continue to maintain personal action plan,with no changes as this time

## 2020-06-15 NOTE — Congregational Nurse Program (Signed)
F/U with program participant (remote monitoring hypertension) and met with her to walk at the [employer] on today. This activity is a part of her action plan, support and motivation to increase her physical activity goal towards acquiring 150 mins toward exercise each week     Successes reported as: -continously monitors her BP and weight equipment provided by program.  -continously uses fit app on watch and pedometer intermittentley to monitor steps and weight in addition to walking at least 3 times a week/30 mins -continues to maintain a healthy diet by eating in small portions, limit no or low salt use -limits junk food/sweets to small amounts versus prior to program     Barriers -None related to monitoring BP and weight; however her heal on her foot sometimes intereferes with the length of time she can walk  Action Plan -Continue to walk at least 3 times a week, but observe foot pain prior to and take measures such as soaking feet, wearing appropriate walking shoes and making sure they are not too flat.

## 2020-06-21 ENCOUNTER — Other Ambulatory Visit: Payer: Self-pay | Admitting: Physician Assistant

## 2020-07-14 ENCOUNTER — Other Ambulatory Visit: Payer: Self-pay | Admitting: Physician Assistant

## 2020-07-30 ENCOUNTER — Other Ambulatory Visit: Payer: Self-pay | Admitting: Physician Assistant

## 2020-08-04 ENCOUNTER — Telehealth: Payer: Self-pay

## 2020-08-05 ENCOUNTER — Other Ambulatory Visit: Payer: Self-pay | Admitting: Physician Assistant

## 2020-08-05 DIAGNOSIS — E785 Hyperlipidemia, unspecified: Secondary | ICD-10-CM

## 2020-08-05 DIAGNOSIS — I10 Essential (primary) hypertension: Secondary | ICD-10-CM

## 2020-08-05 DIAGNOSIS — E118 Type 2 diabetes mellitus with unspecified complications: Secondary | ICD-10-CM

## 2020-08-05 DIAGNOSIS — E039 Hypothyroidism, unspecified: Secondary | ICD-10-CM

## 2020-08-10 NOTE — Congregational Nurse Program (Signed)
Client in today to renew [facility]. Documents scanned and emailed to [facility] for review. No other needs today.  Francee Nodal RN [employer]

## 2020-08-13 ENCOUNTER — Other Ambulatory Visit: Payer: Self-pay

## 2020-08-13 ENCOUNTER — Other Ambulatory Visit (HOSPITAL_COMMUNITY)
Admission: RE | Admit: 2020-08-13 | Discharge: 2020-08-13 | Disposition: A | Discharge status: Home / Self Care | Payer: Self-pay | Source: Ambulatory Visit | Attending: Physician Assistant | Admitting: Physician Assistant

## 2020-08-13 DIAGNOSIS — E785 Hyperlipidemia, unspecified: Secondary | ICD-10-CM | POA: Insufficient documentation

## 2020-08-13 DIAGNOSIS — I10 Essential (primary) hypertension: Secondary | ICD-10-CM | POA: Insufficient documentation

## 2020-08-13 DIAGNOSIS — E039 Hypothyroidism, unspecified: Secondary | ICD-10-CM | POA: Insufficient documentation

## 2020-08-13 DIAGNOSIS — E118 Type 2 diabetes mellitus with unspecified complications: Secondary | ICD-10-CM | POA: Insufficient documentation

## 2020-08-13 LAB — COMPREHENSIVE METABOLIC PANEL
ALT: 22 U/L (ref 0–44)
AST: 19 U/L (ref 15–41)
Albumin: 4 g/dL (ref 3.5–5.0)
Alkaline Phosphatase: 104 U/L (ref 38–126)
Anion gap: 5 (ref 5–15)
BUN: 16 mg/dL (ref 6–20)
CO2: 29 mmol/L (ref 22–32)
Calcium: 9 mg/dL (ref 8.9–10.3)
Chloride: 102 mmol/L (ref 98–111)
Creatinine, Ser: 0.52 mg/dL (ref 0.44–1.00)
GFR, Estimated: >60 mL/min (ref >60)
Glucose, Bld: 95 mg/dL (ref 70–99)
Potassium: 3.8 mmol/L (ref 3.5–5.1)
Sodium: 136 mmol/L (ref 135–145)
Total Bilirubin: 0.6 mg/dL (ref 0.3–1.2)
Total Protein: 7.4 g/dL (ref 6.5–8.1)

## 2020-08-13 LAB — LIPID PANEL
Cholesterol: 158 mg/dL (ref 0–200)
HDL: 40 mg/dL — ABNORMAL LOW (ref >40)
LDL Cholesterol: 91 mg/dL (ref 0–99)
Total CHOL/HDL Ratio: 4 RATIO
Triglycerides: 136 mg/dL (ref <150)
VLDL: 27 mg/dL (ref 0–40)

## 2020-08-13 LAB — HEMOGLOBIN A1C
Hgb A1c MFr Bld: 7.6 % — ABNORMAL HIGH (ref 4.8–5.6)
Mean Plasma Glucose: 171.42 mg/dL

## 2020-08-13 LAB — TSH: TSH: 4.452 u[IU]/mL (ref 0.350–4.500)

## 2020-08-17 ENCOUNTER — Encounter: Payer: Self-pay | Admitting: Physician Assistant

## 2020-08-17 ENCOUNTER — Ambulatory Visit: Payer: Self-pay | Admitting: Physician Assistant

## 2020-08-17 VITALS — BP 117/60

## 2020-08-17 DIAGNOSIS — I10 Essential (primary) hypertension: Secondary | ICD-10-CM

## 2020-08-17 DIAGNOSIS — F419 Anxiety disorder, unspecified: Secondary | ICD-10-CM

## 2020-08-17 DIAGNOSIS — E785 Hyperlipidemia, unspecified: Secondary | ICD-10-CM

## 2020-08-17 DIAGNOSIS — E1165 Type 2 diabetes mellitus with hyperglycemia: Secondary | ICD-10-CM

## 2020-08-17 DIAGNOSIS — E039 Hypothyroidism, unspecified: Secondary | ICD-10-CM

## 2020-08-17 NOTE — Progress Notes (Signed)
BP 117/60    Subjective:    Patient ID: Ashley Chase, female    DOB: 1961-02-24, 60 y.o.   MRN: [id number]  HPI: Ashley Chase is a 60 y.o. female presenting on 08/17/2020 for No chief complaint on file.   HPI   This is a telemedicine appointment due to coronavirus pandemic.  It is via Telephone as pt was having difficulty getting connected through [employer].  I connected with  Ashley Chase on 08/18/20 by a video enabled telemedicine application and verified that I am speaking with the correct person using two identifiers.   I discussed the limitations of evaluation and management by telemedicine. The patient expressed understanding and agreed to proceed.  Pt is at home.  Provider is at office.     Pt is 59yoF with DM, htn and dyslipidemia.  She also has hypothyroidism and anxiety.    She says she is Doing okay.  She is Still working;  She is a caregiver.  She has not gotten covid vaccinations  She knew her a1c was going to be higher because of her recent eating habits.   She monitors her bs  And bp.  Most recent- 117/60.      Relevant past medical, surgical, family and social history reviewed and updated as indicated. Interim medical history since our last visit reviewed. Allergies and medications reviewed and updated.   Current Outpatient Medications:  .  amLODipine (NORVASC) 5 MG tablet, TAKE 1 Tablet BY MOUTH ONCE EVERY DAY, Disp: 90 tablet, Rfl: 0 .  atorvastatin (LIPITOR) 40 MG tablet, TAKE 1 Tablet BY MOUTH ONCE EVERY DAY, Disp: 90 tablet, Rfl: 1 .  citalopram (CELEXA) 40 MG tablet, Take 1 tablet by mouth once daily, Disp: 30 tablet, Rfl: 2 .  diclofenac (VOLTAREN) 75 MG EC tablet, Take 1 tablet by mouth twice daily as needed, Disp: 30 tablet, Rfl: 0 .  insulin glargine (LANTUS SOLOSTAR) 100 UNIT/ML Solostar Pen, Inject 50 Units into the skin at bedtime., Disp: 1 mL, Rfl: 0 .  JANUVIA 100 MG tablet, TAKE 1 Tablet BY MOUTH ONCE EVERY DAY, Disp: 90 tablet, Rfl: 1 .   levothyroxine (SYNTHROID) 25 MCG tablet, TAKE 1 Tablet BY MOUTH ONCE EVERY DAY BEFORE BREAKFAST, Disp: 90 tablet, Rfl: 1 .  lisinopril-hydrochlorothiazide (ZESTORETIC) 20-25 MG tablet, TAKE 1 Tablet BY MOUTH ONCE EVERY DAY, Disp: 90 tablet, Rfl: 1 .  metFORMIN (GLUCOPHAGE) 500 MG tablet, TAKE 1 Tablet  BY MOUTH TWICE DAILY WITH A MEAL, Disp: 180 tablet, Rfl: 1 .  Omega-3 Fatty Acids (FISH OIL) 1000 MG CAPS, 1 po qd, Disp: , Rfl: 0     Review of Systems  Per HPI unless specifically indicated above     Objective:    BP 117/60   Wt Readings from Last 3 Encounters:  05/19/20 189 lb 6.4 oz (85.9 kg)  05/13/20 189 lb 12.8 oz (86.1 kg)  02/11/20 190 lb 4 oz (86.3 kg)    Physical Exam Pulmonary:     Effort: No respiratory distress.     Comments: Pt is talking in complete sentences without sob Neurological:     Mental Status: She is alert and oriented to person, place, and time.  Psychiatric:        Attention and Perception: Attention normal.        Behavior: Behavior is cooperative.     Results for orders placed or performed during the hospital encounter of 08/13/20  TSH  Result Value Ref Range   TSH 4.452  0.350 - 4.500 uIU/mL  Hemoglobin A1c  Result Value Ref Range   Hgb A1c MFr Bld 7.6 (H) 4.8 - 5.6 %   Mean Plasma Glucose 171.42 mg/dL  Lipid panel  Result Value Ref Range   Cholesterol 158 0 - 200 mg/dL   Triglycerides 893 <810 mg/dL   HDL 40 (L) >17 mg/dL   Total CHOL/HDL Ratio 4.0 RATIO   VLDL 27 0 - 40 mg/dL   LDL Cholesterol 91 0 - 99 mg/dL  Comprehensive metabolic panel  Result Value Ref Range   Sodium 136 135 - 145 mmol/L   Potassium 3.8 3.5 - 5.1 mmol/L   Chloride 102 98 - 111 mmol/L   CO2 29 22 - 32 mmol/L   Glucose, Bld 95 70 - 99 mg/dL   BUN 16 6 - 20 mg/dL   Creatinine, Ser 5.10 0.44 - 1.00 mg/dL   Calcium 9.0 8.9 - 25.8 mg/dL   Total Protein 7.4 6.5 - 8.1 g/dL   Albumin 4.0 3.5 - 5.0 g/dL   AST 19 15 - 41 U/L   ALT 22 0 - 44 U/L   Alkaline  Phosphatase 104 38 - 126 U/L   Total Bilirubin 0.6 0.3 - 1.2 mg/dL   GFR, Estimated >52 >77 mL/min   Anion gap 5 5 - 15      Assessment & Plan:     Encounter Diagnoses  Name Primary?  Marland Kitchen Uncontrolled type 2 diabetes mellitus with hyperglycemia (HCC) Yes  . Hyperlipidemia, unspecified hyperlipidemia type   . Essential hypertension   . Hypothyroidism, unspecified type   . Anxiety      Reviewed labs with pt  -pt is counseled to watch diabetic diet -no changes to medications -refer for annual Dm eye exam- last one 09/24/19 -pt encouraged to get covid vaccination -pt to follow up 3 months.  If good witll change to every 4 months.  Pt is to contact office sooner prn

## 2020-09-25 ENCOUNTER — Other Ambulatory Visit: Payer: Self-pay | Admitting: Physician Assistant

## 2020-10-03 ENCOUNTER — Other Ambulatory Visit: Payer: Self-pay | Admitting: Physician Assistant

## 2020-11-03 ENCOUNTER — Other Ambulatory Visit: Payer: Self-pay | Admitting: Physician Assistant

## 2020-11-03 DIAGNOSIS — E785 Hyperlipidemia, unspecified: Secondary | ICD-10-CM

## 2020-11-03 DIAGNOSIS — I10 Essential (primary) hypertension: Secondary | ICD-10-CM

## 2020-11-03 DIAGNOSIS — E1165 Type 2 diabetes mellitus with hyperglycemia: Secondary | ICD-10-CM

## 2020-11-11 ENCOUNTER — Other Ambulatory Visit: Payer: Self-pay

## 2020-11-11 ENCOUNTER — Other Ambulatory Visit (HOSPITAL_COMMUNITY)
Admission: RE | Admit: 2020-11-11 | Discharge: 2020-11-11 | Disposition: A | Discharge status: Home / Self Care | Payer: Self-pay | Source: Ambulatory Visit | Attending: Physician Assistant | Admitting: Physician Assistant

## 2020-11-11 DIAGNOSIS — E1165 Type 2 diabetes mellitus with hyperglycemia: Secondary | ICD-10-CM

## 2020-11-11 DIAGNOSIS — I10 Essential (primary) hypertension: Secondary | ICD-10-CM

## 2020-11-11 DIAGNOSIS — E785 Hyperlipidemia, unspecified: Secondary | ICD-10-CM

## 2020-11-11 LAB — COMPREHENSIVE METABOLIC PANEL
ALT: 18 U/L (ref 0–44)
AST: 17 U/L (ref 15–41)
Albumin: 3.8 g/dL (ref 3.5–5.0)
Alkaline Phosphatase: 107 U/L (ref 38–126)
Anion gap: 7 (ref 5–15)
BUN: 17 mg/dL (ref 6–20)
CO2: 30 mmol/L (ref 22–32)
Calcium: 9 mg/dL (ref 8.9–10.3)
Chloride: 103 mmol/L (ref 98–111)
Creatinine, Ser: 0.52 mg/dL (ref 0.44–1.00)
GFR, Estimated: >60 mL/min (ref >60)
Glucose, Bld: 100 mg/dL — ABNORMAL HIGH (ref 70–99)
Potassium: 4.1 mmol/L (ref 3.5–5.1)
Sodium: 140 mmol/L (ref 135–145)
Total Bilirubin: 0.5 mg/dL (ref 0.3–1.2)
Total Protein: 7.1 g/dL (ref 6.5–8.1)

## 2020-11-11 LAB — LIPID PANEL
Cholesterol: 149 mg/dL (ref 0–200)
HDL: 41 mg/dL (ref >40)
LDL Cholesterol: 86 mg/dL (ref 0–99)
Total CHOL/HDL Ratio: 3.6 RATIO
Triglycerides: 109 mg/dL (ref <150)
VLDL: 22 mg/dL (ref 0–40)

## 2020-11-11 LAB — HEMOGLOBIN A1C
Hgb A1c MFr Bld: 7.3 % — ABNORMAL HIGH (ref 4.8–5.6)
Mean Plasma Glucose: 162.81 mg/dL

## 2020-11-16 ENCOUNTER — Ambulatory Visit: Payer: Self-pay | Admitting: Physician Assistant

## 2020-11-16 ENCOUNTER — Encounter: Payer: Self-pay | Admitting: Physician Assistant

## 2020-11-16 VITALS — BP 132/74 | HR 95 | Temp 97.2°F | Wt 194.0 lb

## 2020-11-16 DIAGNOSIS — E039 Hypothyroidism, unspecified: Secondary | ICD-10-CM

## 2020-11-16 DIAGNOSIS — I10 Essential (primary) hypertension: Secondary | ICD-10-CM

## 2020-11-16 DIAGNOSIS — E118 Type 2 diabetes mellitus with unspecified complications: Secondary | ICD-10-CM

## 2020-11-16 DIAGNOSIS — E785 Hyperlipidemia, unspecified: Secondary | ICD-10-CM

## 2020-11-16 DIAGNOSIS — M79671 Pain in right foot: Secondary | ICD-10-CM

## 2020-11-16 DIAGNOSIS — F419 Anxiety disorder, unspecified: Secondary | ICD-10-CM

## 2020-11-16 NOTE — Progress Notes (Signed)
BP 132/74   Pulse 95   Temp (!) 97.2 F (36.2 C)   Wt 194 lb (88 kg)   SpO2 96%   BMI 37.57 kg/m    Subjective:    Patient ID: Ashley Chase, female    DOB: 06/12/1961, 60 y.o.   MRN: [id number]  HPI: Ashley Chase is a 60 y.o. female presenting on 11/16/2020 for No chief complaint on file.   HPI   Pt had a negative covid 19 screening questionnaire.   Pt is 59yoF who presents for routine follow up of DM, dyslipidemia,  htn and anxiety.  She has not received the covid vaccination  Pt reports R heel pain worse despite treatment for plantar fasciitis  She says her anxiity is good and well controlled  She says she still has her FIT test at home for colon cancer screening .  She reports no SOB or CP.       Relevant past medical, surgical, family and social history reviewed and updated as indicated. Interim medical history since our last visit reviewed. Allergies and medications reviewed and updated.   Current Outpatient Medications:  .  amLODipine (NORVASC) 5 MG tablet, TAKE 1 Tablet BY MOUTH ONCE EVERY DAY, Disp: 90 tablet, Rfl: 1 .  atorvastatin (LIPITOR) 40 MG tablet, TAKE 1 Tablet BY MOUTH ONCE EVERY DAY, Disp: 90 tablet, Rfl: 1 .  cetirizine (ZYRTEC) 10 MG tablet, Take 10 mg by mouth daily., Disp: , Rfl:  .  citalopram (CELEXA) 40 MG tablet, Take 1 tablet by mouth once daily, Disp: 30 tablet, Rfl: 3 .  diclofenac (VOLTAREN) 75 MG EC tablet, Take 1 tablet by mouth twice daily as needed, Disp: 30 tablet, Rfl: 0 .  insulin glargine (LANTUS SOLOSTAR) 100 UNIT/ML Solostar Pen, Inject 50 Units into the skin at bedtime., Disp: 1 mL, Rfl: 0 .  JANUVIA 100 MG tablet, TAKE 1 Tablet BY MOUTH ONCE EVERY DAY, Disp: 90 tablet, Rfl: 1 .  levothyroxine (SYNTHROID) 25 MCG tablet, TAKE 1 Tablet BY MOUTH ONCE EVERY DAY BEFORE BREAKFAST, Disp: 90 tablet, Rfl: 1 .  lisinopril-hydrochlorothiazide (ZESTORETIC) 20-25 MG tablet, TAKE 1 Tablet BY MOUTH ONCE EVERY DAY, Disp: 90 tablet, Rfl: 1 .   metFORMIN (GLUCOPHAGE) 500 MG tablet, TAKE 1 Tablet  BY MOUTH TWICE DAILY WITH A MEAL, Disp: 180 tablet, Rfl: 1 .  Omega-3 Fatty Acids (FISH OIL) 1000 MG CAPS, 1 po qd, Disp: , Rfl: 0   Review of Systems  Per HPI unless specifically indicated above     Objective:    BP 132/74   Pulse 95   Temp (!) 97.2 F (36.2 C)   Wt 194 lb (88 kg)   SpO2 96%   BMI 37.57 kg/m   Wt Readings from Last 3 Encounters:  11/16/20 194 lb (88 kg)  05/19/20 189 lb 6.4 oz (85.9 kg)  05/13/20 189 lb 12.8 oz (86.1 kg)    Physical Exam Vitals reviewed.  Constitutional:      General: She is not in acute distress.    Appearance: She is well-developed. She is obese. She is not toxic-appearing.  HENT:     Head: Normocephalic and atraumatic.  Cardiovascular:     Rate and Rhythm: Normal rate and regular rhythm.     Pulses:          Dorsalis pedis pulses are 2+ on the right side.       Posterior tibial pulses are 2+ on the right side.  Pulmonary:  Effort: Pulmonary effort is normal.     Breath sounds: Normal breath sounds.  Abdominal:     General: Bowel sounds are normal.     Palpations: Abdomen is soft. There is no mass.     Tenderness: There is no abdominal tenderness.  Musculoskeletal:     Cervical back: Neck supple.     Right lower leg: No edema.     Left lower leg: No edema.     Right foot: Normal range of motion.  Feet:     Right foot:     Skin integrity: Skin integrity normal. No erythema or warmth.     Comments: Tenderness plantar surface.  Tenderness postero-medial aspect.  Lymphadenopathy:     Cervical: No cervical adenopathy.  Skin:    General: Skin is warm and dry.  Neurological:     Mental Status: She is alert and oriented to person, place, and time.  Psychiatric:        Behavior: Behavior normal.         Results for orders placed or performed during the hospital encounter of 11/11/20  Lipid panel  Result Value Ref Range   Cholesterol 149 0 - 200 mg/dL    Triglycerides 161 <096 mg/dL   HDL 41 >04 mg/dL   Total CHOL/HDL Ratio 3.6 RATIO   VLDL 22 0 - 40 mg/dL   LDL Cholesterol 86 0 - 99 mg/dL  Hemoglobin V4U  Result Value Ref Range   Hgb A1c MFr Bld 7.3 (H) 4.8 - 5.6 %   Mean Plasma Glucose 162.81 mg/dL  Comprehensive metabolic panel  Result Value Ref Range   Sodium 140 135 - 145 mmol/L   Potassium 4.1 3.5 - 5.1 mmol/L   Chloride 103 98 - 111 mmol/L   CO2 30 22 - 32 mmol/L   Glucose, Bld 100 (H) 70 - 99 mg/dL   BUN 17 6 - 20 mg/dL   Creatinine, Ser 9.81 0.44 - 1.00 mg/dL   Calcium 9.0 8.9 - 19.1 mg/dL   Total Protein 7.1 6.5 - 8.1 g/dL   Albumin 3.8 3.5 - 5.0 g/dL   AST 17 15 - 41 U/L   ALT 18 0 - 44 U/L   Alkaline Phosphatase 107 38 - 126 U/L   Total Bilirubin 0.5 0.3 - 1.2 mg/dL   GFR, Estimated >47 >82 mL/min   Anion gap 7 5 - 15      Assessment & Plan:   Encounter Diagnoses  Name Primary?  . Controlled diabetes mellitus type 2 with complications, unspecified whether long term insulin use (HCC)   . Hyperlipidemia, unspecified hyperlipidemia type Yes  . Essential hypertension   . Hypothyroidism, unspecified type   . Anxiety   . Pain of right heel     -Reviewed labs with pt -She is reminded to return FIT test for colon cancer screening  -screening mammogram is UTD -pt is on list for annual DM eye exam -will get xray right heel -Refer to orthopedics for heel pain -pt says she has [employer] financial assistance -pt to follow up 4 months.  She is to contact office sooner prn

## 2020-11-16 NOTE — Patient Instructions (Signed)
RETURN STOOL / POOP TEST

## 2020-11-17 ENCOUNTER — Ambulatory Visit (HOSPITAL_COMMUNITY)
Admission: RE | Admit: 2020-11-17 | Discharge: 2020-11-17 | Disposition: A | Discharge status: Home / Self Care | Payer: Self-pay | Source: Ambulatory Visit | Attending: Physician Assistant | Admitting: Physician Assistant

## 2020-11-17 DIAGNOSIS — M79671 Pain in right foot: Secondary | ICD-10-CM | POA: Insufficient documentation

## 2020-11-23 ENCOUNTER — Telehealth: Payer: Self-pay

## 2020-11-23 NOTE — Telephone Encounter (Signed)
Client of [facility]. Returned client's call regarding information about her Haematologist. Reviewed with Abran Duke and client will need to submit new application. Reviewed needed documents such as 2021 taxes, last 4 pay stubs and 3 most recent bank statements. Client will check for all her documents. Tentatively plan to come into [facility] office to reapply 11/23/20. Client is needing to see orthopedist . Referral noted in primary provider at [facility] notes and ambulatory referrals. No appointment scheduled to date due to need for financial assistance for consultation and treatment.   Will follow as needed. Francee Nodal RN [facility]

## 2020-11-24 ENCOUNTER — Other Ambulatory Visit: Payer: Self-pay | Admitting: Physician Assistant

## 2020-12-09 ENCOUNTER — Telehealth: Payer: Self-pay

## 2020-12-09 NOTE — Telephone Encounter (Signed)
Client called, she now has her 2021 taxes to resubmit CA[employer]. She will also have last 4 paystubs and last 3 bank statements. We will also renew [facility] at that time.  Client scheduled for 12/11/20 at 2:30 PM.   Francee Nodal RN [facility]

## 2020-12-16 NOTE — Telephone Encounter (Signed)
Pt was made an appointment with CC to receive assistance with getting her requests completed and renewed

## 2020-12-29 ENCOUNTER — Other Ambulatory Visit: Payer: Self-pay | Admitting: Physician Assistant

## 2020-12-30 ENCOUNTER — Telehealth: Payer: Self-pay

## 2020-12-30 NOTE — Telephone Encounter (Signed)
Returned client's call regarding status of her [employer]FA application. Contacted D. Boyd with [facility] and client has been 100% approved from 12/15/20 until 06/16/21.  Francee Nodal RN [facility]

## 2021-01-12 ENCOUNTER — Ambulatory Visit: Payer: Self-pay | Admitting: Orthopaedic Surgery

## 2021-01-26 ENCOUNTER — Other Ambulatory Visit: Payer: Self-pay

## 2021-01-26 ENCOUNTER — Encounter: Payer: Self-pay | Admitting: Orthopedic Surgery

## 2021-01-26 ENCOUNTER — Ambulatory Visit (INDEPENDENT_AMBULATORY_CARE_PROVIDER_SITE_OTHER): Payer: Self-pay | Admitting: Orthopedic Surgery

## 2021-01-26 VITALS — BP 141/79 | HR 77 | Ht 60.25 in | Wt 194.0 lb

## 2021-01-26 DIAGNOSIS — M7661 Achilles tendinitis, right leg: Secondary | ICD-10-CM

## 2021-01-26 NOTE — Patient Instructions (Signed)
Voltaren gel on your heel, available over the counter

## 2021-01-27 ENCOUNTER — Encounter: Payer: Self-pay | Admitting: Orthopedic Surgery

## 2021-01-27 NOTE — Progress Notes (Signed)
New Patient Visit  Assessment: Ashley Chase is a 60 y.o. female with the following: 1. Tendonitis, Achilles, right  Plan: Patient is point tender at the insertion of the right Achilles tendon.  There are some small osteophytes at the insertion.  No known injury.  Recommend Voltaren topical gel and provided her with exercises with a focus on eccentric exercises.  No surgical intervention warranted.  Continue with medications as needed and icing.  No activity restrictions.   Follow-up: Return if symptoms worsen or fail to improve.  Subjective:  Chief Complaint  Patient presents with   Foot Pain    Rt heel pain getting worse since Feb '22. Pt states she noticed a knot that came up Nov '21 after going to [place].     History of Present Illness: Ashley Chase is a 60 y.o. female who has been referred to clinic today by Soyla Dryer, PA-C for right heel pain.  Her pain started approximately 9 months ago while on vacation.  No known injury.  Since then her pain has progressively worsened.  She reports point tenderness.  She has been icing her heel.  She has been taking OTC medications with little improvement.  No physical therapy.  She has previously been treated for plantar fasciitis of the right foot, but has not responded to stretching and similar exercises.  She does have a history of left fasciitis of left foot.   Review of Systems: No fevers or chills No numbness or tingling No chest pain No shortness of breath No bowel or bladder dysfunction No GI distress No headaches   Medical History:  Past Medical History:  Diagnosis Date   Depression    after mother's death in 09-07-11   Diabetes mellitus without complication ([place])    Hypertension     Past Surgical History:  Procedure Laterality Date   ECTOPIC PREGNANCY SURGERY     one ovary removed    Family History  Problem Relation Age of Onset   Multiple myeloma Mother    Cirrhosis Father    Social History   Tobacco Use    Smoking status: Former    Packs/day: 0.25    Years: 10.00    Pack years: 2.50    Types: Cigarettes    Quit date: 07/04/1997    Years since quitting: 23.5   Smokeless tobacco: Never  Vaping Use   Vaping Use: Never used  Substance Use Topics   Alcohol use: Yes    Comment: occasional glass of wine   Drug use: Never    No Known Allergies  Current Meds  Medication Sig   amLODipine (NORVASC) 5 MG tablet TAKE 1 Tablet BY MOUTH ONCE EVERY DAY   atorvastatin (LIPITOR) 40 MG tablet TAKE 1 Tablet BY MOUTH ONCE EVERY DAY   cetirizine (ZYRTEC) 10 MG tablet Take 10 mg by mouth daily.   citalopram (CELEXA) 40 MG tablet Take 1 tablet by mouth once daily   diclofenac (VOLTAREN) 75 MG EC tablet Take 1 tablet by mouth twice daily as needed   insulin glargine (LANTUS SOLOSTAR) 100 UNIT/ML Solostar Pen Inject 50 Units into the skin at bedtime.   JANUVIA 100 MG tablet TAKE 1 Tablet BY MOUTH ONCE EVERY DAY   levothyroxine (SYNTHROID) 25 MCG tablet TAKE 1 Tablet BY MOUTH ONCE EVERY DAY BEFORE BREAKFAST   lisinopril-hydrochlorothiazide (ZESTORETIC) 20-25 MG tablet TAKE 1 Tablet BY MOUTH ONCE EVERY DAY   metFORMIN (GLUCOPHAGE) 500 MG tablet TAKE 1 Tablet  BY MOUTH TWICE DAILY WITH  A MEAL   Omega-3 Fatty Acids (FISH OIL) 1000 MG CAPS 1 po qd    Objective: BP (!) 141/79   Pulse 77   Ht 5' 0.25" (1.53 m)   Wt 194 lb (88 kg)   BMI 37.57 kg/m   Physical Exam:  General: Alert and oriented. and No acute distress. Gait: Right sided antalgic gait.  Evaluation of the right foot demonstrates no deformity.  There is a small amount of swelling at the insertion of the Achilles.  This is point tender.  There is no overlying discoloration.  No ecchymosis is appreciated.  She does exhibit pain with dorsiflexion of the ankle.  She is able to achieve 5 degrees dorsiflexion of the knee in full extension, 15 degrees with the knee in flexion.  Toes warm and well-perfused.  No tenderness palpation along the spring  ligament.  IMAGING: I personally reviewed images previously obtained in clinic  Nonweightbearing x-rays of the right foot were previously obtained in clinic.  There are some small osteophytes at the insertion of the Achilles.  Small bone spur on the plantar aspect of the calcaneus.  Otherwise foot x-ray appears normal.   New Medications:  No orders of the defined types were placed in this encounter.     Mordecai Rasmussen, MD  01/27/2021 7:26 AM

## 2021-01-29 ENCOUNTER — Other Ambulatory Visit: Payer: Self-pay | Admitting: Physician Assistant

## 2021-02-03 ENCOUNTER — Telehealth: Payer: Self-pay

## 2021-02-03 NOTE — Telephone Encounter (Signed)
Spoke with pt in regards to picking up insulin, informed that she needs to sign renewal application, pt came in to sign and received vials of insulin until hers arrives.

## 2021-03-10 ENCOUNTER — Other Ambulatory Visit: Payer: Self-pay | Admitting: Physician Assistant

## 2021-03-10 DIAGNOSIS — E118 Type 2 diabetes mellitus with unspecified complications: Secondary | ICD-10-CM

## 2021-03-10 DIAGNOSIS — E785 Hyperlipidemia, unspecified: Secondary | ICD-10-CM

## 2021-03-10 DIAGNOSIS — E039 Hypothyroidism, unspecified: Secondary | ICD-10-CM

## 2021-03-10 DIAGNOSIS — I10 Essential (primary) hypertension: Secondary | ICD-10-CM

## 2021-03-18 ENCOUNTER — Other Ambulatory Visit: Payer: Self-pay

## 2021-03-18 ENCOUNTER — Other Ambulatory Visit (HOSPITAL_COMMUNITY)
Admission: RE | Admit: 2021-03-18 | Discharge: 2021-03-18 | Disposition: A | Discharge status: Home / Self Care | Payer: Self-pay | Source: Ambulatory Visit | Attending: Physician Assistant | Admitting: Physician Assistant

## 2021-03-18 DIAGNOSIS — E039 Hypothyroidism, unspecified: Secondary | ICD-10-CM | POA: Insufficient documentation

## 2021-03-18 DIAGNOSIS — E118 Type 2 diabetes mellitus with unspecified complications: Secondary | ICD-10-CM | POA: Insufficient documentation

## 2021-03-18 DIAGNOSIS — I10 Essential (primary) hypertension: Secondary | ICD-10-CM | POA: Insufficient documentation

## 2021-03-18 DIAGNOSIS — E785 Hyperlipidemia, unspecified: Secondary | ICD-10-CM | POA: Insufficient documentation

## 2021-03-18 LAB — COMPREHENSIVE METABOLIC PANEL
ALT: 19 U/L (ref 0–44)
AST: 16 U/L (ref 15–41)
Albumin: 4 g/dL (ref 3.5–5.0)
Alkaline Phosphatase: 155 U/L — ABNORMAL HIGH (ref 38–126)
Anion gap: 9 (ref 5–15)
BUN: 13 mg/dL (ref 8–23)
CO2: 30 mmol/L (ref 22–32)
Calcium: 9.2 mg/dL (ref 8.9–10.3)
Chloride: 101 mmol/L (ref 98–111)
Creatinine, Ser: 0.59 mg/dL (ref 0.44–1.00)
GFR, Estimated: >60 mL/min (ref >60)
Glucose, Bld: 118 mg/dL — ABNORMAL HIGH (ref 70–99)
Potassium: 3.7 mmol/L (ref 3.5–5.1)
Sodium: 140 mmol/L (ref 135–145)
Total Bilirubin: 0.6 mg/dL (ref 0.3–1.2)
Total Protein: 7.4 g/dL (ref 6.5–8.1)

## 2021-03-18 LAB — LIPID PANEL
Cholesterol: 147 mg/dL (ref 0–200)
HDL: 37 mg/dL — ABNORMAL LOW (ref >40)
LDL Cholesterol: 86 mg/dL (ref 0–99)
Total CHOL/HDL Ratio: 4 RATIO
Triglycerides: 122 mg/dL (ref <150)
VLDL: 24 mg/dL (ref 0–40)

## 2021-03-18 LAB — TSH: TSH: 3.911 u[IU]/mL (ref 0.350–4.500)

## 2021-03-18 LAB — HEMOGLOBIN A1C
Hgb A1c MFr Bld: 7.7 % — ABNORMAL HIGH (ref 4.8–5.6)
Mean Plasma Glucose: 174.29 mg/dL

## 2021-03-19 LAB — MICROALBUMIN, URINE: Microalb, Ur: 35.7 ug/mL — ABNORMAL HIGH

## 2021-03-22 ENCOUNTER — Ambulatory Visit: Payer: Self-pay | Admitting: Physician Assistant

## 2021-03-22 ENCOUNTER — Encounter: Payer: Self-pay | Admitting: Physician Assistant

## 2021-03-22 VITALS — BP 120/76 | HR 86 | Temp 97.6°F | Wt 190.0 lb

## 2021-03-22 DIAGNOSIS — I10 Essential (primary) hypertension: Secondary | ICD-10-CM

## 2021-03-22 DIAGNOSIS — E1165 Type 2 diabetes mellitus with hyperglycemia: Secondary | ICD-10-CM

## 2021-03-22 DIAGNOSIS — E039 Hypothyroidism, unspecified: Secondary | ICD-10-CM

## 2021-03-22 DIAGNOSIS — E785 Hyperlipidemia, unspecified: Secondary | ICD-10-CM

## 2021-03-22 NOTE — Progress Notes (Signed)
BP 120/76   Pulse 86   Temp 97.6 F (36.4 C)   Wt 190 lb (86.2 kg)   SpO2 94%   BMI 36.80 kg/m    Subjective:    Patient ID: Ashley Chase, female    DOB: [birth year], 60 y.o.   MRN: [id number]  HPI: Ashley Chase is a 60 y.o. female presenting on 03/22/2021 for Hypertension   HPI   Pt had a negative covid 19 screening questionnaire.  Chief Complaint  Patient presents with   Hypertension   Diabetes   Hyperlipidemia   Hypothyroidism      She still has her FIT test that she  was given for colon cancer screening.    She isn't working now and says she has been eating too much.  She is aware that this has affected her diabetes.  She has not gotten the covid vaccination.  She says she feels well and she has no complaints.       Relevant past medical, surgical, family and social history reviewed and updated as indicated. Interim medical history since our last visit reviewed. Allergies and medications reviewed and updated.   Current Outpatient Medications:    amLODipine (NORVASC) 5 MG tablet, TAKE 1 Tablet BY MOUTH ONCE EVERY DAY, Disp: 90 tablet, Rfl: 1   atorvastatin (LIPITOR) 40 MG tablet, TAKE 1 Tablet BY MOUTH ONCE EVERY DAY, Disp: 90 tablet, Rfl: 1   citalopram (CELEXA) 40 MG tablet, Take 1 tablet by mouth once daily, Disp: 30 tablet, Rfl: 3   diclofenac (VOLTAREN) 75 MG EC tablet, Take 1 tablet by mouth twice daily as needed, Disp: 30 tablet, Rfl: 0   insulin glargine (LANTUS SOLOSTAR) 100 UNIT/ML Solostar Pen, Inject 50 Units into the skin at bedtime., Disp: 1 mL, Rfl: 0   JANUVIA 100 MG tablet, TAKE 1 Tablet BY MOUTH ONCE EVERY DAY, Disp: 90 tablet, Rfl: 1   levothyroxine (SYNTHROID) 25 MCG tablet, TAKE 1 Tablet BY MOUTH ONCE EVERY DAY BEFORE BREAKFAST, Disp: 90 tablet, Rfl: 1   lisinopril-hydrochlorothiazide (ZESTORETIC) 20-25 MG tablet, TAKE 1 Tablet BY MOUTH ONCE EVERY DAY, Disp: 90 tablet, Rfl: 1   metFORMIN (GLUCOPHAGE) 500 MG tablet, TAKE 1 Tablet  BY MOUTH  TWICE DAILY WITH A MEAL, Disp: 180 tablet, Rfl: 1   Omega-3 Fatty Acids (FISH OIL) 1000 MG CAPS, 1 po qd, Disp: , Rfl: 0   cetirizine (ZYRTEC) 10 MG tablet, Take 10 mg by mouth daily. (Patient not taking: Reported on 03/22/2021), Disp: , Rfl:     Review of Systems  Per HPI unless specifically indicated above     Objective:    BP 120/76   Pulse 86   Temp 97.6 F (36.4 C)   Wt 190 lb (86.2 kg)   SpO2 94%   BMI 36.80 kg/m   Wt Readings from Last 3 Encounters:  03/22/21 190 lb (86.2 kg)  01/26/21 194 lb (88 kg)  11/16/20 194 lb (88 kg)    Physical Exam Vitals reviewed.  Constitutional:      General: She is not in acute distress.    Appearance: She is well-developed. She is obese. She is not ill-appearing.  HENT:     Head: Normocephalic and atraumatic.  Cardiovascular:     Rate and Rhythm: Normal rate and regular rhythm.  Pulmonary:     Effort: Pulmonary effort is normal.     Breath sounds: Normal breath sounds.  Abdominal:     General: Bowel sounds are normal.  Palpations: Abdomen is soft. There is no mass.     Tenderness: There is no abdominal tenderness.  Musculoskeletal:     Cervical back: Neck supple.     Right lower leg: No edema.     Left lower leg: No edema.  Lymphadenopathy:     Cervical: No cervical adenopathy.  Skin:    General: Skin is warm and dry.  Neurological:     Mental Status: She is alert and oriented to person, place, and time.  Psychiatric:        Behavior: Behavior normal.    Results for orders placed or performed during the hospital encounter of 03/18/21  TSH  Result Value Ref Range   TSH 3.911 0.350 - 4.500 uIU/mL  Microalbumin, urine  Result Value Ref Range   Microalb, Ur 35.7 (H) Not Estab. ug/mL  Hemoglobin A1c  Result Value Ref Range   Hgb A1c MFr Bld 7.7 (H) 4.8 - 5.6 %   Mean Plasma Glucose 174.29 mg/dL  Lipid panel  Result Value Ref Range   Cholesterol 147 0 - 200 mg/dL   Triglycerides 235 <573 mg/dL   HDL 37 (L) >22  mg/dL   Total CHOL/HDL Ratio 4.0 RATIO   VLDL 24 0 - 40 mg/dL   LDL Cholesterol 86 0 - 99 mg/dL  Comprehensive metabolic panel  Result Value Ref Range   Sodium 140 135 - 145 mmol/L   Potassium 3.7 3.5 - 5.1 mmol/L   Chloride 101 98 - 111 mmol/L   CO2 30 22 - 32 mmol/L   Glucose, Bld 118 (H) 70 - 99 mg/dL   BUN 13 8 - 23 mg/dL   Creatinine, Ser 0.25 0.44 - 1.00 mg/dL   Calcium 9.2 8.9 - 42.7 mg/dL   Total Protein 7.4 6.5 - 8.1 g/dL   Albumin 4.0 3.5 - 5.0 g/dL   AST 16 15 - 41 U/L   ALT 19 0 - 44 U/L   Alkaline Phosphatase 155 (H) 38 - 126 U/L   Total Bilirubin 0.6 0.3 - 1.2 mg/dL   GFR, Estimated >06 >23 mL/min   Anion gap 9 5 - 15      Assessment & Plan:   Encounter Diagnoses  Name Primary?   Uncontrolled type 2 diabetes mellitus with hyperglycemia (HCC) Yes   Hyperlipidemia, unspecified hyperlipidemia type    Essential hypertension    Hypothyroidism, unspecified type       -reviewed labs with pt  -will refer for screening Mammogram -Pt was encouraged to return FIT test for colon cancer screening -She has dm eye exam later this month -DM foot exam was updated -discussed uncontrolled DM and she says she will get back to watching what she eats and she doesn't need her meds adjusted -No changes to medications -pt is on dental list. -Pt to follow up 3 months.  She is to contact office sooner prn

## 2021-03-29 ENCOUNTER — Other Ambulatory Visit: Payer: Self-pay | Admitting: Physician Assistant

## 2021-03-30 ENCOUNTER — Other Ambulatory Visit: Payer: Self-pay

## 2021-03-30 DIAGNOSIS — Z1231 Encounter for screening mammogram for malignant neoplasm of breast: Secondary | ICD-10-CM

## 2021-04-12 ENCOUNTER — Ambulatory Visit (HOSPITAL_COMMUNITY)
Admission: RE | Admit: 2021-04-12 | Discharge: 2021-04-12 | Disposition: A | Discharge status: Home / Self Care | Payer: Self-pay | Source: Ambulatory Visit | Attending: Physician Assistant | Admitting: Physician Assistant

## 2021-04-12 ENCOUNTER — Other Ambulatory Visit: Payer: Self-pay

## 2021-04-12 DIAGNOSIS — Z1231 Encounter for screening mammogram for malignant neoplasm of breast: Secondary | ICD-10-CM | POA: Insufficient documentation

## 2021-05-20 ENCOUNTER — Telehealth: Payer: Self-pay

## 2021-05-20 NOTE — Telephone Encounter (Signed)
Call from pt requesting order for insulin & stated she will be in to get insulin to last her until shipped.

## 2021-06-01 ENCOUNTER — Other Ambulatory Visit: Payer: Self-pay | Admitting: Physician Assistant

## 2021-06-01 ENCOUNTER — Telehealth: Payer: Self-pay | Admitting: Licensed Clinical Social Worker

## 2021-06-01 NOTE — Telephone Encounter (Signed)
[facility] left voicemail for patient inquiring if she was interested in counseling services, contact information was left.

## 2021-06-07 ENCOUNTER — Other Ambulatory Visit: Payer: Self-pay | Admitting: Physician Assistant

## 2021-06-07 DIAGNOSIS — I10 Essential (primary) hypertension: Secondary | ICD-10-CM

## 2021-06-07 DIAGNOSIS — E785 Hyperlipidemia, unspecified: Secondary | ICD-10-CM

## 2021-06-07 DIAGNOSIS — E1165 Type 2 diabetes mellitus with hyperglycemia: Secondary | ICD-10-CM

## 2021-06-10 ENCOUNTER — Telehealth: Payer: Self-pay

## 2021-06-10 NOTE — Telephone Encounter (Signed)
Spoke with pt to inform insulin delivered.

## 2021-06-15 ENCOUNTER — Ambulatory Visit: Payer: Self-pay | Admitting: Licensed Clinical Social Worker

## 2021-06-15 ENCOUNTER — Other Ambulatory Visit: Payer: Self-pay

## 2021-06-15 DIAGNOSIS — F32A Depression, unspecified: Secondary | ICD-10-CM

## 2021-06-15 NOTE — Progress Notes (Signed)
[facility] engaged patient in initial session. [facility] provided reflective listening and validation as patient shared about past traumas/losses and current depressive symptoms. First follow-up session was scheduled for 06/30/21 at 11 am.

## 2021-06-18 ENCOUNTER — Other Ambulatory Visit (HOSPITAL_COMMUNITY)
Admission: RE | Admit: 2021-06-18 | Discharge: 2021-06-18 | Disposition: A | Discharge status: Home / Self Care | Payer: Self-pay | Source: Ambulatory Visit | Attending: Physician Assistant | Admitting: Physician Assistant

## 2021-06-18 DIAGNOSIS — E785 Hyperlipidemia, unspecified: Secondary | ICD-10-CM | POA: Insufficient documentation

## 2021-06-18 DIAGNOSIS — E1165 Type 2 diabetes mellitus with hyperglycemia: Secondary | ICD-10-CM | POA: Insufficient documentation

## 2021-06-18 DIAGNOSIS — I10 Essential (primary) hypertension: Secondary | ICD-10-CM | POA: Insufficient documentation

## 2021-06-18 LAB — COMPREHENSIVE METABOLIC PANEL
ALT: 20 U/L (ref 0–44)
AST: 19 U/L (ref 15–41)
Albumin: 3.9 g/dL (ref 3.5–5.0)
Alkaline Phosphatase: 118 U/L (ref 38–126)
Anion gap: 10 (ref 5–15)
BUN: 15 mg/dL (ref 8–23)
CO2: 26 mmol/L (ref 22–32)
Calcium: 9 mg/dL (ref 8.9–10.3)
Chloride: 102 mmol/L (ref 98–111)
Creatinine, Ser: 0.53 mg/dL (ref 0.44–1.00)
GFR, Estimated: >60 mL/min (ref >60)
Glucose, Bld: 86 mg/dL (ref 70–99)
Potassium: 3.6 mmol/L (ref 3.5–5.1)
Sodium: 138 mmol/L (ref 135–145)
Total Bilirubin: 0.6 mg/dL (ref 0.3–1.2)
Total Protein: 7.3 g/dL (ref 6.5–8.1)

## 2021-06-18 LAB — LIPID PANEL
Cholesterol: 141 mg/dL (ref 0–200)
HDL: 37 mg/dL — ABNORMAL LOW (ref >40)
LDL Cholesterol: 85 mg/dL (ref 0–99)
Total CHOL/HDL Ratio: 3.8 RATIO
Triglycerides: 95 mg/dL (ref <150)
VLDL: 19 mg/dL (ref 0–40)

## 2021-06-18 LAB — HEMOGLOBIN A1C
Hgb A1c MFr Bld: 8.1 % — ABNORMAL HIGH (ref 4.8–5.6)
Mean Plasma Glucose: 185.77 mg/dL

## 2021-06-21 ENCOUNTER — Other Ambulatory Visit: Payer: Self-pay | Admitting: Physician Assistant

## 2021-06-21 ENCOUNTER — Encounter: Payer: Self-pay | Admitting: Physician Assistant

## 2021-06-21 ENCOUNTER — Ambulatory Visit: Payer: Self-pay | Admitting: Physician Assistant

## 2021-06-21 VITALS — BP 115/74

## 2021-06-21 DIAGNOSIS — Z1211 Encounter for screening for malignant neoplasm of colon: Secondary | ICD-10-CM

## 2021-06-21 DIAGNOSIS — E785 Hyperlipidemia, unspecified: Secondary | ICD-10-CM

## 2021-06-21 DIAGNOSIS — E039 Hypothyroidism, unspecified: Secondary | ICD-10-CM

## 2021-06-21 DIAGNOSIS — I10 Essential (primary) hypertension: Secondary | ICD-10-CM

## 2021-06-21 DIAGNOSIS — E1165 Type 2 diabetes mellitus with hyperglycemia: Secondary | ICD-10-CM

## 2021-06-21 DIAGNOSIS — F32A Depression, unspecified: Secondary | ICD-10-CM

## 2021-06-21 LAB — IFOBT (OCCULT BLOOD): IFOBT: NEGATIVE

## 2021-06-21 NOTE — Progress Notes (Signed)
BP 115/74    Subjective:    Patient ID: Ashley Chase, female    DOB: 10-22-1960, 60 y.o.   MRN: [id number]  HPI: Ashley Chase is a 60 y.o. female presenting on 06/21/2021 for No chief complaint on file.   HPI   This is a telemedicine appointment through [employer]  I connected with  Ashley Chase on 06/22/21 by a video enabled telemedicine application and verified that I am speaking with the correct person using two identifiers.   I discussed the limitations of evaluation and management by telemedicine. The patient expressed understanding and agreed to proceed.  Pt is at home.  Provider is at office.    Pt is 60yoF with DM, HTN and dyslipidemia.  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  She is working 5 days/week for 4 hours each day.  She says it is in [place] so she is very happy.  She has no complaints today.   She monitors her BP at home.  She says the counseling is helping.   She has not got covid vaccinations    Relevant past medical, surgical, family and social history reviewed and updated as indicated. Interim medical history since our last visit reviewed. Allergies and medications reviewed and updated.    Current Outpatient Medications:    amLODipine (NORVASC) 5 MG tablet, TAKE 1 Tablet BY MOUTH ONCE DAILY, Disp: 90 tablet, Rfl: 1   atorvastatin (LIPITOR) 40 MG tablet, TAKE 1 Tablet BY MOUTH ONCE EVERY DAY, Disp: 90 tablet, Rfl: 1   citalopram (CELEXA) 40 MG tablet, Take 1 tablet by mouth once daily, Disp: 30 tablet, Rfl: 1   insulin glargine (LANTUS SOLOSTAR) 100 UNIT/ML Solostar Pen, Inject 50 Units into the skin at bedtime., Disp: 1 mL, Rfl: 0   JANUVIA 100 MG tablet, TAKE 1 Tablet BY MOUTH ONCE EVERY DAY, Disp: 90 tablet, Rfl: 1   levothyroxine (SYNTHROID) 25 MCG tablet, TAKE 1 Tablet BY MOUTH ONCE EVERY DAY BEFORE BREAKFAST, Disp: 90 tablet, Rfl: 1   lisinopril-hydrochlorothiazide (ZESTORETIC) 20-25 MG tablet, TAKE 1 Tablet BY MOUTH ONCE  EVERY DAY, Disp: 90 tablet, Rfl: 1   metFORMIN (GLUCOPHAGE) 500 MG tablet, TAKE 1 Tablet  BY MOUTH TWICE DAILY WITH A MEAL, Disp: 180 tablet, Rfl: 1   Omega-3 Fatty Acids (FISH OIL) 1000 MG CAPS, 1 po qd, Disp: , Rfl: 0   cetirizine (ZYRTEC) 10 MG tablet, Take 10 mg by mouth daily. (Patient not taking: Reported on 03/22/2021), Disp: , Rfl:    diclofenac (VOLTAREN) 75 MG EC tablet, Take 1 tablet by mouth twice daily as needed (Patient not taking: Reported on 06/21/2021), Disp: 30 tablet, Rfl: 0    Review of Systems  Per HPI unless specifically indicated above     Objective:    BP 115/74   Wt Readings from Last 3 Encounters:  03/22/21 190 lb (86.2 kg)  01/26/21 194 lb (88 kg)  11/16/20 194 lb (88 kg)    Physical Exam Constitutional:      General: She is not in acute distress.    Appearance: She is not toxic-appearing.  HENT:     Head: Normocephalic and atraumatic.  Pulmonary:     Effort: Pulmonary effort is normal. No respiratory distress.     Comments: Pt is talking in complete sentences without dyspnea Neurological:     Mental Status: She is alert and oriented to person, place, and time.  Psychiatric:  Behavior: Behavior normal.    Results for orders placed or performed in visit on 06/21/21  IFOBT POC (occult bld, rslt in office)  Result Value Ref Range   IFOBT Negative       Assessment & Plan:    Encounter Diagnoses  Name Primary?   Uncontrolled type 2 diabetes mellitus with hyperglycemia (HCC) Yes   Hyperlipidemia, unspecified hyperlipidemia type    Essential hypertension    Hypothyroidism, unspecified type    Depression, unspecified depression type       -Reviewed labs with pt  -pt is encouraged to watch diabetic diet and get regular exercise.  Will not increase her DM meds as she has some fbs readings in the 80s.   -pt to continue current medications -pt is encouraged to get covid vaccination -pt to follow up 3 months.  She is to contact office  sooner prn

## 2021-06-29 ENCOUNTER — Other Ambulatory Visit: Payer: Self-pay | Admitting: Physician Assistant

## 2021-06-30 ENCOUNTER — Other Ambulatory Visit: Payer: Self-pay

## 2021-06-30 ENCOUNTER — Ambulatory Visit: Payer: Self-pay | Admitting: Licensed Clinical Social Worker

## 2021-06-30 DIAGNOSIS — F32A Depression, unspecified: Secondary | ICD-10-CM

## 2021-06-30 NOTE — Progress Notes (Signed)
[facility] engaged patient in follow-up [facility] session. [facility] provided mirroring and validation as patient shared about past traumas and current depressive symptoms. [facility] led patient in progressive muscle relaxation. Patient reported that their sleep quality has increased since last session.

## 2021-07-14 ENCOUNTER — Ambulatory Visit: Payer: Self-pay | Admitting: Licensed Clinical Social Worker

## 2021-07-14 DIAGNOSIS — F32A Depression, unspecified: Secondary | ICD-10-CM

## 2021-07-14 NOTE — Progress Notes (Signed)
[facility] engaged patient in follow-up session. [facility] provided mirroring and normalization of emotions as patient shared about past traumas and current symptoms. [facility] reviewed self-care strategies with patient, as well as anger-regulation exercises.

## 2021-07-28 ENCOUNTER — Ambulatory Visit: Payer: Self-pay | Admitting: Licensed Clinical Social Worker

## 2021-07-28 ENCOUNTER — Other Ambulatory Visit: Payer: Self-pay

## 2021-07-28 DIAGNOSIS — F32A Depression, unspecified: Secondary | ICD-10-CM

## 2021-07-28 NOTE — Progress Notes (Signed)
[facility] engaged patient in follow-up session. [facility] provided reflective listening and validation as patient shared about current job stressors. [facility] helped patient process different ways to care for self to prevent burn-out.

## 2021-08-02 ENCOUNTER — Other Ambulatory Visit: Payer: Self-pay | Admitting: Physician Assistant

## 2021-08-05 ENCOUNTER — Ambulatory Visit: Payer: Self-pay | Admitting: Physician Assistant

## 2021-08-05 DIAGNOSIS — K0510 Chronic gingivitis, plaque induced: Secondary | ICD-10-CM

## 2021-08-05 MED ORDER — NYSTATIN 100000 UNIT/ML MT SUSP: 5.0000 mL | Freq: Four times a day (QID) | OROMUCOSAL | 0 refills | Status: DC | PRN

## 2021-08-05 NOTE — Progress Notes (Signed)
° °  There were no vitals taken for this visit.   Subjective:    Patient ID: Ashley Chase, female    DOB: 1961/04/14, 61 y.o.   MRN: [id number]  HPI: Jamil Castillo is a 61 y.o. female presenting on 08/05/2021 for No chief complaint on file.   HPI   This is a telemedicine appointment through [employer].   I connected with  Ashley Chase on 08/05/2021 by a video enabled telemedicine application and verified that I am speaking with the correct person using two identifiers.   I discussed the limitations of evaluation and management by telemedicine. The patient expressed understanding and agreed to proceed.  Pt is at work.  Provider is at office.   Pt c/o Gums bleeding.  She has poor dentition with many teeth missing.  She says she is having no particular tooth problem at this time, just her gums.   Relevant past medical, surgical, family and social history reviewed and updated as indicated. Interim medical history since our last visit reviewed. Allergies and medications reviewed and updated.  Review of Systems  Per HPI unless specifically indicated above     Objective:    There were no vitals taken for this visit.  Wt Readings from Last 3 Encounters:  03/22/21 190 lb (86.2 kg)  01/26/21 194 lb (88 kg)  11/16/20 194 lb (88 kg)    Physical Exam Constitutional:      General: She is not in acute distress.    Appearance: She is not toxic-appearing.  HENT:     Head: Normocephalic and atraumatic.     Mouth/Throat:     Dentition: Abnormal dentition.     Comments: No swelling of the face.  No dental abscess.  Many teeth missing. Neurological:     Mental Status: She is alert and oriented to person, place, and time.  Psychiatric:        Attention and Perception: Attention normal.        Speech: Speech normal.        Behavior: Behavior normal. Behavior is cooperative.        Assessment & Plan:    Encounter Diagnosis  Name Primary?   Gingivitis Yes      Rx magic mouthwash.  Pt  counseled on good dental hygiene.  Pt is on dental list

## 2021-08-06 ENCOUNTER — Encounter: Payer: Self-pay | Admitting: Physician Assistant

## 2021-08-11 ENCOUNTER — Ambulatory Visit: Payer: Self-pay | Admitting: Licensed Clinical Social Worker

## 2021-08-11 ENCOUNTER — Other Ambulatory Visit: Payer: Self-pay

## 2021-08-11 ENCOUNTER — Encounter: Payer: Self-pay | Admitting: Physician Assistant

## 2021-08-11 DIAGNOSIS — F32A Depression, unspecified: Secondary | ICD-10-CM

## 2021-08-11 NOTE — Progress Notes (Signed)
[facility] engaged patient in follow-up [facility] session. [facility] provided active listening and validation as patient shared about stress at work and decision to retire early. [facility] helped patient process her thoughts and emotions related to her decision to retire. Next session was scheduled for 3/1 at 9 am.

## 2021-09-01 ENCOUNTER — Ambulatory Visit: Payer: Self-pay | Admitting: Licensed Clinical Social Worker

## 2021-09-01 ENCOUNTER — Other Ambulatory Visit: Payer: Self-pay

## 2021-09-01 DIAGNOSIS — F32A Depression, unspecified: Secondary | ICD-10-CM

## 2021-09-01 NOTE — Progress Notes (Signed)
[facility] engaged patient in follow-up session. [facility] provided mirroring and empathetic presence as patient shared about grief and past losses. [facility] assisted patient in processing thoughts and feelings related. ?

## 2021-09-07 ENCOUNTER — Other Ambulatory Visit: Payer: Self-pay | Admitting: Physician Assistant

## 2021-09-07 DIAGNOSIS — E785 Hyperlipidemia, unspecified: Secondary | ICD-10-CM

## 2021-09-07 DIAGNOSIS — E1165 Type 2 diabetes mellitus with hyperglycemia: Secondary | ICD-10-CM

## 2021-09-07 DIAGNOSIS — E039 Hypothyroidism, unspecified: Secondary | ICD-10-CM

## 2021-09-07 DIAGNOSIS — I10 Essential (primary) hypertension: Secondary | ICD-10-CM

## 2021-09-09 ENCOUNTER — Telehealth: Payer: Self-pay

## 2021-09-09 NOTE — Telephone Encounter (Signed)
Called pt to inform insulin delivered, left vm to call back °

## 2021-09-20 ENCOUNTER — Other Ambulatory Visit: Payer: Self-pay | Admitting: Physician Assistant

## 2021-09-21 ENCOUNTER — Other Ambulatory Visit: Payer: Self-pay

## 2021-09-21 ENCOUNTER — Ambulatory Visit: Payer: Self-pay | Admitting: Licensed Clinical Social Worker

## 2021-09-21 DIAGNOSIS — F419 Anxiety disorder, unspecified: Secondary | ICD-10-CM

## 2021-09-21 DIAGNOSIS — F32A Depression, unspecified: Secondary | ICD-10-CM

## 2021-09-21 NOTE — Progress Notes (Signed)
[facility] engaged patient in follow-up visit. [facility] provided reflective listening and validation as patient shared about anxiety related to leaving house and complicated grief from various losses in her life. [facility] assisted patient in identifying negative thoughts related to anxiety and finding alternative thoughts that are more accurate and helpful.  ?

## 2021-09-24 ENCOUNTER — Other Ambulatory Visit (HOSPITAL_COMMUNITY)
Admission: RE | Admit: 2021-09-24 | Discharge: 2021-09-24 | Disposition: A | Discharge status: Home / Self Care | Payer: Self-pay | Source: Ambulatory Visit | Attending: Physician Assistant | Admitting: Physician Assistant

## 2021-09-24 DIAGNOSIS — I10 Essential (primary) hypertension: Secondary | ICD-10-CM | POA: Insufficient documentation

## 2021-09-24 DIAGNOSIS — E039 Hypothyroidism, unspecified: Secondary | ICD-10-CM | POA: Insufficient documentation

## 2021-09-24 DIAGNOSIS — E785 Hyperlipidemia, unspecified: Secondary | ICD-10-CM | POA: Insufficient documentation

## 2021-09-24 DIAGNOSIS — E1165 Type 2 diabetes mellitus with hyperglycemia: Secondary | ICD-10-CM | POA: Insufficient documentation

## 2021-09-24 LAB — COMPREHENSIVE METABOLIC PANEL
ALT: 17 U/L (ref 0–44)
AST: 17 U/L (ref 15–41)
Albumin: 3.9 g/dL (ref 3.5–5.0)
Alkaline Phosphatase: 118 U/L (ref 38–126)
Anion gap: 9 (ref 5–15)
BUN: 17 mg/dL (ref 8–23)
CO2: 30 mmol/L (ref 22–32)
Calcium: 9.2 mg/dL (ref 8.9–10.3)
Chloride: 101 mmol/L (ref 98–111)
Creatinine, Ser: 0.66 mg/dL (ref 0.44–1.00)
GFR, Estimated: >60 mL/min (ref >60)
Glucose, Bld: 88 mg/dL (ref 70–99)
Potassium: 4.3 mmol/L (ref 3.5–5.1)
Sodium: 140 mmol/L (ref 135–145)
Total Bilirubin: 0.5 mg/dL (ref 0.3–1.2)
Total Protein: 7.3 g/dL (ref 6.5–8.1)

## 2021-09-24 LAB — LIPID PANEL
Cholesterol: 140 mg/dL (ref 0–200)
HDL: 38 mg/dL — ABNORMAL LOW (ref >40)
LDL Cholesterol: 81 mg/dL (ref 0–99)
Total CHOL/HDL Ratio: 3.7 RATIO
Triglycerides: 106 mg/dL (ref <150)
VLDL: 21 mg/dL (ref 0–40)

## 2021-09-24 LAB — HEMOGLOBIN A1C
Hgb A1c MFr Bld: 7.3 % — ABNORMAL HIGH (ref 4.8–5.6)
Mean Plasma Glucose: 162.81 mg/dL

## 2021-09-24 LAB — TSH: TSH: 3.045 u[IU]/mL (ref 0.350–4.500)

## 2021-09-27 ENCOUNTER — Encounter: Payer: Self-pay | Admitting: Physician Assistant

## 2021-09-27 ENCOUNTER — Ambulatory Visit: Payer: Self-pay | Admitting: Physician Assistant

## 2021-09-27 VITALS — BP 124/72 | HR 88 | Temp 97.2°F

## 2021-09-27 DIAGNOSIS — E118 Type 2 diabetes mellitus with unspecified complications: Secondary | ICD-10-CM

## 2021-09-27 DIAGNOSIS — F419 Anxiety disorder, unspecified: Secondary | ICD-10-CM

## 2021-09-27 DIAGNOSIS — E785 Hyperlipidemia, unspecified: Secondary | ICD-10-CM

## 2021-09-27 DIAGNOSIS — F32A Depression, unspecified: Secondary | ICD-10-CM

## 2021-09-27 DIAGNOSIS — E039 Hypothyroidism, unspecified: Secondary | ICD-10-CM

## 2021-09-27 DIAGNOSIS — I10 Essential (primary) hypertension: Secondary | ICD-10-CM

## 2021-09-27 NOTE — Patient Instructions (Signed)
COVID-19 vaccination significantly lowers your risk of severe illness, hospitalization, and death if you get infected. Compared to people who are up to date with their COVID-19 vaccinations, unvaccinated people aremore likely to get COVID-19, much more likely to be hospitalized with COVID-19, and much more likely to die from COVID-19. ?Like all vaccines, COVID-19 vaccines are not 100% effective at preventing infection. Some people who are up to date with their COVID-19 vaccinations will get COVID-19 breakthrough infection. However, staying up to date with your COVID-19 vaccinations means that you are less likely to have a breakthrough infection and, if you do get sick, you are less likely to get severely ill or die. Staying up to date with COVID-19 vaccination also means you are less likely to spread the disease to others and increases your protection against new variants of SARS-CoV-2, the virus that causes COVID-19. ? ?

## 2021-09-27 NOTE — Progress Notes (Signed)
? ?BP 124/72   Pulse 88   Temp (!) 97.2 ?F (36.2 ?C)   SpO2 97%   ? ?Subjective:  ? ? Patient ID: Ashley Chase, female    DOB: 19-Feb-1961, 61 y.o.   MRN: [id number] ? ?HPI: ?Ashley Chase is a 61 y.o. female presenting on 09/27/2021 for No chief complaint on file. ? ? ?HPI ? ? ?Pt is 60yoF who presents for routine follow up DM htn dyslipidemia.  She says she is doing well today and has no complaints.   ? ?She is Not working right now.   She starts with a new client tomorrow.  ? ?She is monitoring her bs at home.  the highest was 146,  Lowest 91. ? ?She is seeing [facility] for counseling and she says it is very helpful.  ? ? ? ? ?Relevant past medical, surgical, family and social history reviewed and updated as indicated. Interim medical history since our last visit reviewed. ?Allergies and medications reviewed and updated. ? ? ?Current Outpatient Medications:  ?  amLODipine (NORVASC) 5 MG tablet, TAKE 1 Tablet BY MOUTH ONCE DAILY, Disp: 90 tablet, Rfl: 1 ?  atorvastatin (LIPITOR) 40 MG tablet, TAKE 1 Tablet BY MOUTH ONCE EVERY DAY, Disp: 90 tablet, Rfl: 1 ?  citalopram (CELEXA) 40 MG tablet, Take 1 tablet by mouth once daily, Disp: 30 tablet, Rfl: 2 ?  insulin glargine (LANTUS SOLOSTAR) 100 UNIT/ML Solostar Pen, Inject 50 Units into the skin at bedtime., Disp: 1 mL, Rfl: 0 ?  JANUVIA 100 MG tablet, TAKE 1 Tablet BY MOUTH ONCE EVERY DAY, Disp: 90 tablet, Rfl: 1 ?  levothyroxine (SYNTHROID) 25 MCG tablet, TAKE 1 Tablet BY MOUTH ONCE EVERY DAY BEFORE BREAKFAST, Disp: 90 tablet, Rfl: 1 ?  lisinopril-hydrochlorothiazide (ZESTORETIC) 20-25 MG tablet, TAKE 1 Tablet BY MOUTH ONCE EVERY DAY, Disp: 90 tablet, Rfl: 1 ?  metFORMIN (GLUCOPHAGE) 500 MG tablet, TAKE 1 Tablet  BY MOUTH TWICE DAILY WITH A MEAL, Disp: 180 tablet, Rfl: 1 ?  naproxen sodium (ALEVE) 220 MG tablet, Take 220 mg by mouth., Disp: , Rfl:  ?  Omega-3 Fatty Acids (FISH OIL) 1000 MG CAPS, 1 po qd, Disp: , Rfl: 0 ?  cetirizine (ZYRTEC) 10 MG tablet, Take 10 mg by mouth  daily. (Patient not taking: Reported on 03/22/2021), Disp: , Rfl:  ? ? ? ?Review of Systems ? ?Per HPI unless specifically indicated above ? ?   ?Objective:  ?  ?BP 124/72   Pulse 88   Temp (!) 97.2 ?F (36.2 ?C)   SpO2 97%   ?Wt Readings from Last 3 Encounters:  ?03/22/21 190 lb (86.2 kg)  ?01/26/21 194 lb (88 kg)  ?11/16/20 194 lb (88 kg)  ?  ?Physical Exam ?Vitals reviewed.  ?Constitutional:   ?   General: She is not in acute distress. ?   Appearance: She is well-developed. She is not ill-appearing.  ?HENT:  ?   Head: Normocephalic and atraumatic.  ?Cardiovascular:  ?   Rate and Rhythm: Normal rate and regular rhythm.  ?Pulmonary:  ?   Effort: Pulmonary effort is normal.  ?   Breath sounds: Normal breath sounds.  ?Abdominal:  ?   General: Bowel sounds are normal.  ?   Palpations: Abdomen is soft. There is no mass.  ?   Tenderness: There is no abdominal tenderness.  ?Musculoskeletal:  ?   Cervical back: Neck supple.  ?   Right lower leg: No edema.  ?   Left lower leg: No  edema.  ?Lymphadenopathy:  ?   Cervical: No cervical adenopathy.  ?Skin: ?   General: Skin is warm and dry.  ?Neurological:  ?   Mental Status: She is alert and oriented to person, place, and time.  ?Psychiatric:     ?   Behavior: Behavior normal.  ? ? ?Results for orders placed or performed during the hospital encounter of 09/24/21  ?TSH  ?Result Value Ref Range  ? TSH 3.045 0.350 - 4.500 uIU/mL  ?Hemoglobin A1c  ?Result Value Ref Range  ? Hgb A1c MFr Bld 7.3 (H) 4.8 - 5.6 %  ? Mean Plasma Glucose 162.81 mg/dL  ?Lipid panel  ?Result Value Ref Range  ? Cholesterol 140 0 - 200 mg/dL  ? Triglycerides 106 <150 mg/dL  ? HDL 38 (L) >40 mg/dL  ? Total CHOL/HDL Ratio 3.7 RATIO  ? VLDL 21 0 - 40 mg/dL  ? LDL Cholesterol 81 0 - 99 mg/dL  ?Comprehensive metabolic panel  ?Result Value Ref Range  ? Sodium 140 135 - 145 mmol/L  ? Potassium 4.3 3.5 - 5.1 mmol/L  ? Chloride 101 98 - 111 mmol/L  ? CO2 30 22 - 32 mmol/L  ? Glucose, Bld 88 70 - 99 mg/dL  ? BUN 17  8 - 23 mg/dL  ? Creatinine, Ser 0.66 0.44 - 1.00 mg/dL  ? Calcium 9.2 8.9 - 10.3 mg/dL  ? Total Protein 7.3 6.5 - 8.1 g/dL  ? Albumin 3.9 3.5 - 5.0 g/dL  ? AST 17 15 - 41 U/L  ? ALT 17 0 - 44 U/L  ? Alkaline Phosphatase 118 38 - 126 U/L  ? Total Bilirubin 0.5 0.3 - 1.2 mg/dL  ? GFR, Estimated >60 >60 mL/min  ? Anion gap 9 5 - 15  ? ?   ?Assessment & Plan:  ? ? ?Encounter Diagnoses  ?Name Primary?  ? Controlled diabetes mellitus type 2 with complications, unspecified whether long term insulin use (HCC) Yes  ? Hyperlipidemia, unspecified hyperlipidemia type   ? Essential hypertension   ? Hypothyroidism, unspecified type   ? Depression, unspecified depression type   ? Anxiety   ? ? ? ?-Reviewed labs with pt ?-Pt to continue current rx ?-HCM UTD ?-pt to continue with [facility] as scheduled ?-pt to Follow up 3 months.  She is to contact office sooner prn ? ? ?

## 2021-10-12 ENCOUNTER — Ambulatory Visit: Payer: Self-pay | Admitting: Licensed Clinical Social Worker

## 2021-10-12 DIAGNOSIS — F32A Depression, unspecified: Secondary | ICD-10-CM

## 2021-10-12 NOTE — Progress Notes (Signed)
[facility] engaged patient in follow-up session. [facility] provided reflective listening and validation as patient shared about family issues. [facility] supported patient in expressing her thoughts and feelings related to past experiences with family members.  ?

## 2021-11-02 ENCOUNTER — Ambulatory Visit: Payer: Self-pay | Admitting: Licensed Clinical Social Worker

## 2021-11-02 DIAGNOSIS — F32A Depression, unspecified: Secondary | ICD-10-CM

## 2021-11-02 NOTE — Progress Notes (Signed)
[facility] engaged client in follow-up session. [facility] provided active listening and validation as client shared about slight depression/anxiety symptoms related to not working currently. [facility] encouraged and worked to strengthen [employer] of gardening. ?

## 2021-11-06 ENCOUNTER — Other Ambulatory Visit: Payer: Self-pay | Admitting: Physician Assistant

## 2021-12-07 ENCOUNTER — Ambulatory Visit: Payer: Self-pay | Admitting: Licensed Clinical Social Worker

## 2021-12-14 ENCOUNTER — Other Ambulatory Visit: Payer: Self-pay | Admitting: Physician Assistant

## 2021-12-14 DIAGNOSIS — E118 Type 2 diabetes mellitus with unspecified complications: Secondary | ICD-10-CM

## 2021-12-14 DIAGNOSIS — E785 Hyperlipidemia, unspecified: Secondary | ICD-10-CM

## 2021-12-14 DIAGNOSIS — I10 Essential (primary) hypertension: Secondary | ICD-10-CM

## 2021-12-17 ENCOUNTER — Other Ambulatory Visit: Payer: Self-pay | Admitting: Physician Assistant

## 2021-12-27 ENCOUNTER — Other Ambulatory Visit (HOSPITAL_COMMUNITY)
Admission: RE | Admit: 2021-12-27 | Discharge: 2021-12-27 | Disposition: A | Discharge status: Home / Self Care | Payer: Self-pay | Attending: Physician Assistant | Admitting: Physician Assistant

## 2021-12-27 DIAGNOSIS — E785 Hyperlipidemia, unspecified: Secondary | ICD-10-CM | POA: Insufficient documentation

## 2021-12-27 DIAGNOSIS — I10 Essential (primary) hypertension: Secondary | ICD-10-CM | POA: Insufficient documentation

## 2021-12-27 DIAGNOSIS — E119 Type 2 diabetes mellitus without complications: Secondary | ICD-10-CM | POA: Insufficient documentation

## 2021-12-27 DIAGNOSIS — E118 Type 2 diabetes mellitus with unspecified complications: Secondary | ICD-10-CM

## 2021-12-27 LAB — LIPID PANEL
Cholesterol: 137 mg/dL (ref 0–200)
HDL: 37 mg/dL — ABNORMAL LOW (ref >40)
LDL Cholesterol: 78 mg/dL (ref 0–99)
Total CHOL/HDL Ratio: 3.7 RATIO
Triglycerides: 110 mg/dL (ref <150)
VLDL: 22 mg/dL (ref 0–40)

## 2021-12-27 LAB — COMPREHENSIVE METABOLIC PANEL
ALT: 16 U/L (ref 0–44)
AST: 18 U/L (ref 15–41)
Albumin: 3.8 g/dL (ref 3.5–5.0)
Alkaline Phosphatase: 122 U/L (ref 38–126)
Anion gap: 10 (ref 5–15)
BUN: 14 mg/dL (ref 8–23)
CO2: 28 mmol/L (ref 22–32)
Calcium: 8.9 mg/dL (ref 8.9–10.3)
Chloride: 101 mmol/L (ref 98–111)
Creatinine, Ser: 0.71 mg/dL (ref 0.44–1.00)
GFR, Estimated: >60 mL/min (ref >60)
Glucose, Bld: 98 mg/dL (ref 70–99)
Potassium: 3.8 mmol/L (ref 3.5–5.1)
Sodium: 139 mmol/L (ref 135–145)
Total Bilirubin: 0.8 mg/dL (ref 0.3–1.2)
Total Protein: 7.5 g/dL (ref 6.5–8.1)

## 2021-12-28 LAB — HEMOGLOBIN A1C
Hgb A1c MFr Bld: 7.3 % — ABNORMAL HIGH (ref 4.8–5.6)
Mean Plasma Glucose: 163 mg/dL

## 2021-12-29 ENCOUNTER — Ambulatory Visit: Payer: Self-pay | Admitting: Physician Assistant

## 2021-12-29 ENCOUNTER — Encounter: Payer: Self-pay | Admitting: Physician Assistant

## 2021-12-29 VITALS — BP 108/70 | HR 103 | Temp 96.5°F | Wt 186.0 lb

## 2021-12-29 DIAGNOSIS — I10 Essential (primary) hypertension: Secondary | ICD-10-CM

## 2021-12-29 DIAGNOSIS — E785 Hyperlipidemia, unspecified: Secondary | ICD-10-CM

## 2021-12-29 DIAGNOSIS — E118 Type 2 diabetes mellitus with unspecified complications: Secondary | ICD-10-CM

## 2021-12-29 NOTE — Patient Instructions (Signed)
COVID-19 vaccination significantly lowers your risk of severe illness, hospitalization, and death if you get infected. Compared to people who are up to date with their COVID-19 vaccinations, unvaccinated people aremore likely to get COVID-19, much more likely to be hospitalized with COVID-19, and much more likely to die from COVID-19. ?Like all vaccines, COVID-19 vaccines are not 100% effective at preventing infection. Some people who are up to date with their COVID-19 vaccinations will get COVID-19 breakthrough infection. However, staying up to date with your COVID-19 vaccinations means that you are less likely to have a breakthrough infection and, if you do get sick, you are less likely to get severely ill or die. Staying up to date with COVID-19 vaccination also means you are less likely to spread the disease to others and increases your protection against new variants of SARS-CoV-2, the virus that causes COVID-19. ? ?

## 2021-12-29 NOTE — Progress Notes (Signed)
BP 108/70   Pulse (!) 103   Temp (!) 96.5 F (35.8 C)   Wt 186 lb (84.4 kg)   SpO2 98%   BMI 36.02 kg/m    Subjective:    Patient ID: Ashley Chase, female    DOB: Nov 25, 1960, 61 y.o.   MRN: [id number]  HPI: Ashley Chase is a 61 y.o. female presenting on 12/29/2021 for Diabetes, Hyperlipidemia, and Hypertension   HPI   Chief Complaint  Patient presents with   Diabetes   Hyperlipidemia   Hypertension     Pt got new client at work this week and it has been helpful.   She is monitoring her bs.  This month:  Lowest 74.  Highest 165  She says she sometimes gets a tremor right hand but no other complaints.     Relevant past medical, surgical, family and social history reviewed and updated as indicated. Interim medical history since our last visit reviewed. Allergies and medications reviewed and updated.   Current Outpatient Medications:    amLODipine (NORVASC) 5 MG tablet, TAKE 1 Tablet BY MOUTH ONCE DAILY, Disp: 90 tablet, Rfl: 1   atorvastatin (LIPITOR) 40 MG tablet, TAKE 1 Tablet BY MOUTH ONCE EVERY DAY, Disp: 90 tablet, Rfl: 1   cetirizine (ZYRTEC) 10 MG tablet, Take 10 mg by mouth daily., Disp: , Rfl:    citalopram (CELEXA) 40 MG tablet, Take 1 tablet by mouth once daily, Disp: 30 tablet, Rfl: 1   insulin glargine (LANTUS SOLOSTAR) 100 UNIT/ML Solostar Pen, Inject 50 Units into the skin at bedtime., Disp: 1 mL, Rfl: 0   JANUVIA 100 MG tablet, TAKE 1 Tablet BY MOUTH ONCE EVERY DAY, Disp: 90 tablet, Rfl: 1   levothyroxine (SYNTHROID) 25 MCG tablet, TAKE 1 Tablet BY MOUTH ONCE EVERY DAY BEFORE BREAKFAST, Disp: 90 tablet, Rfl: 1   lisinopril-hydrochlorothiazide (ZESTORETIC) 20-25 MG tablet, TAKE 1 Tablet BY MOUTH ONCE EVERY DAY, Disp: 90 tablet, Rfl: 1   metFORMIN (GLUCOPHAGE) 500 MG tablet, TAKE 1 Tablet  BY MOUTH TWICE DAILY WITH A MEAL, Disp: 180 tablet, Rfl: 1   naproxen sodium (ALEVE) 220 MG tablet, Take 220 mg by mouth., Disp: , Rfl:    Omega-3 Fatty Acids (FISH OIL)  1000 MG CAPS, 1 po qd, Disp: , Rfl: 0    Review of Systems  Per HPI unless specifically indicated above     Objective:    BP 108/70   Pulse (!) 103   Temp (!) 96.5 F (35.8 C)   Wt 186 lb (84.4 kg)   SpO2 98%   BMI 36.02 kg/m   Wt Readings from Last 3 Encounters:  12/29/21 186 lb (84.4 kg)  03/22/21 190 lb (86.2 kg)  01/26/21 194 lb (88 kg)    Physical Exam Vitals reviewed.  Constitutional:      General: She is not in acute distress.    Appearance: She is well-developed. She is obese. She is not toxic-appearing.  HENT:     Head: Normocephalic and atraumatic.  Cardiovascular:     Rate and Rhythm: Normal rate and regular rhythm.  Pulmonary:     Effort: Pulmonary effort is normal.     Breath sounds: Normal breath sounds.  Abdominal:     General: Bowel sounds are normal.     Palpations: Abdomen is soft. There is no mass.     Tenderness: There is no abdominal tenderness.  Musculoskeletal:     Right hand: No swelling, deformity or tenderness. Normal range of motion.  Normal strength. Normal pulse.     Left hand: Normal range of motion. Normal pulse.     Cervical back: Neck supple.     Right lower leg: No edema.     Left lower leg: No edema.     Comments: Some dermatitis dorsal surface right hand.  No tremor.  Lymphadenopathy:     Cervical: No cervical adenopathy.  Skin:    General: Skin is warm and dry.  Neurological:     Mental Status: She is alert and oriented to person, place, and time.  Psychiatric:        Behavior: Behavior normal.       Results for orders placed or performed during the hospital encounter of 12/27/21  Lipid panel  Result Value Ref Range   Cholesterol 137 0 - 200 mg/dL   Triglycerides 793 <903 mg/dL   HDL 37 (L) >00 mg/dL   Total CHOL/HDL Ratio 3.7 RATIO   VLDL 22 0 - 40 mg/dL   LDL Cholesterol 78 0 - 99 mg/dL  Hemoglobin P2Z  Result Value Ref Range   Hgb A1c MFr Bld 7.3 (H) 4.8 - 5.6 %   Mean Plasma Glucose 163 mg/dL   Comprehensive metabolic panel  Result Value Ref Range   Sodium 139 135 - 145 mmol/L   Potassium 3.8 3.5 - 5.1 mmol/L   Chloride 101 98 - 111 mmol/L   CO2 28 22 - 32 mmol/L   Glucose, Bld 98 70 - 99 mg/dL   BUN 14 8 - 23 mg/dL   Creatinine, Ser 3.00 0.44 - 1.00 mg/dL   Calcium 8.9 8.9 - 76.2 mg/dL   Total Protein 7.5 6.5 - 8.1 g/dL   Albumin 3.8 3.5 - 5.0 g/dL   AST 18 15 - 41 U/L   ALT 16 0 - 44 U/L   Alkaline Phosphatase 122 38 - 126 U/L   Total Bilirubin 0.8 0.3 - 1.2 mg/dL   GFR, Estimated >26 >33 mL/min   Anion gap 10 5 - 15         Assessment & Plan:    Encounter Diagnoses  Name Primary?   Controlled diabetes mellitus type 2 with complications, unspecified whether long term insulin use (HCC) Yes   Hyperlipidemia, unspecified hyperlipidemia type    Essential hypertension      -reviewed labs with pt -DM Foot exam UTD -DM Eye exam UTD -FIT test for colon cancer screening  UTD -screening Mammogram UTD -pt to continue current medications.  She is encouraged to watch DM diet.  Goal for a1c is a bit lower than the 7.3 that it is today -encouraged strengthening exercises to improve arm strength and limit tremors.  Discussed squeezing a ball and using lightweight weights for curls -pt to follow up 4 months.  She is to contact office sooner prn

## 2022-01-10 ENCOUNTER — Other Ambulatory Visit: Payer: Self-pay | Admitting: Physician Assistant

## 2022-02-22 ENCOUNTER — Encounter: Payer: Self-pay | Admitting: Physician Assistant

## 2022-02-22 ENCOUNTER — Ambulatory Visit: Payer: Self-pay | Admitting: Physician Assistant

## 2022-02-22 VITALS — BP 122/78 | HR 70 | Temp 97.6°F | Wt 185.0 lb

## 2022-02-22 DIAGNOSIS — E118 Type 2 diabetes mellitus with unspecified complications: Secondary | ICD-10-CM

## 2022-02-22 DIAGNOSIS — K029 Dental caries, unspecified: Secondary | ICD-10-CM

## 2022-02-22 DIAGNOSIS — K0510 Chronic gingivitis, plaque induced: Secondary | ICD-10-CM

## 2022-02-22 DIAGNOSIS — K0889 Other specified disorders of teeth and supporting structures: Secondary | ICD-10-CM

## 2022-02-22 LAB — GLUCOSE, POCT (MANUAL RESULT ENTRY): POC Glucose: 94 mg/dl (ref 70–99)

## 2022-02-22 MED ORDER — CHLORHEXIDINE GLUCONATE 0.12 % MT SOLN: 15.0000 mL | Freq: Two times a day (BID) | OROMUCOSAL | 1 refills | Status: DC

## 2022-02-22 NOTE — Progress Notes (Signed)
   BP 122/78   Pulse 70   Temp 97.6 F (36.4 C)   Wt 185 lb (83.9 kg)   SpO2 97%   BMI 35.83 kg/m    Subjective:    Patient ID: Ashley Chase, female    DOB: 1961/03/19, 61 y.o.   MRN: [id number]  HPI: [name] is a 61 y.o. female presenting on 02/22/2022 for Dental Problem (Pt is having problems with her gums and most of her teeth are loose due to problems with her gums. Pt states she is having trouble eating. Gums are swollen and painful. Pt has been doing salt water rinse, and cepacol antibacterial mouthwash.)   HPI   Chief Complaint  Patient presents with   Dental Problem    Pt is having problems with her gums and most of her teeth are loose due to problems with her gums. Pt states she is having trouble eating. Gums are swollen and painful. Pt has been doing salt water rinse, and cepacol antibacterial mouthwash.      Relevant past medical, surgical, family and social history reviewed and updated as indicated. Interim medical history since our last visit reviewed. Allergies and medications reviewed and updated.  Review of Systems  Per HPI unless specifically indicated above     Objective:    BP 122/78   Pulse 70   Temp 97.6 F (36.4 C)   Wt 185 lb (83.9 kg)   SpO2 97%   BMI 35.83 kg/m   Wt Readings from Last 3 Encounters:  02/22/22 185 lb (83.9 kg)  12/29/21 186 lb (84.4 kg)  03/22/21 190 lb (86.2 kg)    Physical Exam Constitutional:      General: She is not in acute distress.    Appearance: She is not toxic-appearing.  HENT:     Head: Normocephalic and atraumatic.     Mouth/Throat:     Comments: No swelling of face.   No abscess in mouth seen.   Tooth decay and gum disease as pictured.   Pulmonary:     Effort: Pulmonary effort is normal. No respiratory distress.  Neurological:     Mental Status: She is alert and oriented to person, place, and time.  Psychiatric:        Behavior: Behavior normal.                 Assessment & Plan:     Encounter Diagnoses  Name Primary?   Dentalgia Yes   Dental decay    Gingivitis    Controlled diabetes mellitus type 2 with complications, unspecified whether long term insulin use (HCC)      -eNcouraged good oral hygiene.  Rx peridex. -Pt already on dental list.  Will try to get her moved up

## 2022-03-17 ENCOUNTER — Other Ambulatory Visit: Payer: Self-pay | Admitting: Physician Assistant

## 2022-04-12 ENCOUNTER — Other Ambulatory Visit: Payer: Self-pay | Admitting: Physician Assistant

## 2022-04-12 DIAGNOSIS — I10 Essential (primary) hypertension: Secondary | ICD-10-CM

## 2022-04-12 DIAGNOSIS — E118 Type 2 diabetes mellitus with unspecified complications: Secondary | ICD-10-CM

## 2022-04-12 DIAGNOSIS — E785 Hyperlipidemia, unspecified: Secondary | ICD-10-CM

## 2022-04-12 DIAGNOSIS — E039 Hypothyroidism, unspecified: Secondary | ICD-10-CM

## 2022-04-22 ENCOUNTER — Other Ambulatory Visit (HOSPITAL_COMMUNITY)
Admission: RE | Admit: 2022-04-22 | Discharge: 2022-04-22 | Disposition: A | Discharge status: Home / Self Care | Payer: Self-pay | Source: Ambulatory Visit | Attending: Physician Assistant | Admitting: Physician Assistant

## 2022-04-22 DIAGNOSIS — E039 Hypothyroidism, unspecified: Secondary | ICD-10-CM | POA: Insufficient documentation

## 2022-04-22 DIAGNOSIS — E785 Hyperlipidemia, unspecified: Secondary | ICD-10-CM | POA: Insufficient documentation

## 2022-04-22 DIAGNOSIS — E118 Type 2 diabetes mellitus with unspecified complications: Secondary | ICD-10-CM | POA: Insufficient documentation

## 2022-04-22 DIAGNOSIS — I10 Essential (primary) hypertension: Secondary | ICD-10-CM | POA: Insufficient documentation

## 2022-04-22 LAB — HEMOGLOBIN A1C
Hgb A1c MFr Bld: 7 % — ABNORMAL HIGH (ref 4.8–5.6)
Mean Plasma Glucose: 154.2 mg/dL

## 2022-04-22 LAB — COMPREHENSIVE METABOLIC PANEL
ALT: 18 U/L (ref 0–44)
AST: 17 U/L (ref 15–41)
Albumin: 3.8 g/dL (ref 3.5–5.0)
Alkaline Phosphatase: 135 U/L — ABNORMAL HIGH (ref 38–126)
Anion gap: 9 (ref 5–15)
BUN: 19 mg/dL (ref 8–23)
CO2: 28 mmol/L (ref 22–32)
Calcium: 9 mg/dL (ref 8.9–10.3)
Chloride: 103 mmol/L (ref 98–111)
Creatinine, Ser: 0.66 mg/dL (ref 0.44–1.00)
GFR, Estimated: >60 mL/min (ref >60)
Glucose, Bld: 83 mg/dL (ref 70–99)
Potassium: 3.7 mmol/L (ref 3.5–5.1)
Sodium: 140 mmol/L (ref 135–145)
Total Bilirubin: 0.8 mg/dL (ref 0.3–1.2)
Total Protein: 7.2 g/dL (ref 6.5–8.1)

## 2022-04-22 LAB — LIPID PANEL
Cholesterol: 134 mg/dL (ref 0–200)
HDL: 38 mg/dL — ABNORMAL LOW (ref >40)
LDL Cholesterol: 81 mg/dL (ref 0–99)
Total CHOL/HDL Ratio: 3.5 RATIO
Triglycerides: 75 mg/dL (ref <150)
VLDL: 15 mg/dL (ref 0–40)

## 2022-04-22 LAB — TSH: TSH: 3.166 u[IU]/mL (ref 0.350–4.500)

## 2022-04-23 LAB — MICROALBUMIN, URINE: Microalb, Ur: 48.5 ug/mL — ABNORMAL HIGH

## 2022-04-26 ENCOUNTER — Encounter: Payer: Self-pay | Admitting: Physician Assistant

## 2022-04-26 ENCOUNTER — Other Ambulatory Visit: Payer: Self-pay | Admitting: Physician Assistant

## 2022-04-26 ENCOUNTER — Ambulatory Visit: Payer: Self-pay | Admitting: Physician Assistant

## 2022-04-26 VITALS — BP 108/70 | HR 86 | Temp 97.3°F | Wt 182.0 lb

## 2022-04-26 DIAGNOSIS — E118 Type 2 diabetes mellitus with unspecified complications: Secondary | ICD-10-CM

## 2022-04-26 DIAGNOSIS — Z1231 Encounter for screening mammogram for malignant neoplasm of breast: Secondary | ICD-10-CM

## 2022-04-26 DIAGNOSIS — Z5320 Procedure and treatment not carried out because of patient's decision for unspecified reasons: Secondary | ICD-10-CM

## 2022-04-26 DIAGNOSIS — Z1211 Encounter for screening for malignant neoplasm of colon: Secondary | ICD-10-CM

## 2022-04-26 DIAGNOSIS — E669 Obesity, unspecified: Secondary | ICD-10-CM

## 2022-04-26 DIAGNOSIS — F419 Anxiety disorder, unspecified: Secondary | ICD-10-CM

## 2022-04-26 DIAGNOSIS — I10 Essential (primary) hypertension: Secondary | ICD-10-CM

## 2022-04-26 DIAGNOSIS — E785 Hyperlipidemia, unspecified: Secondary | ICD-10-CM

## 2022-04-26 DIAGNOSIS — E039 Hypothyroidism, unspecified: Secondary | ICD-10-CM

## 2022-04-26 MED ORDER — ATORVASTATIN CALCIUM 40 MG PO TABS: ORAL_TABLET | ORAL | 1 refills | Status: DC

## 2022-04-26 MED ORDER — LEVOTHYROXINE SODIUM 25 MCG PO TABS: ORAL_TABLET | ORAL | 1 refills | Status: DC

## 2022-04-26 MED ORDER — AMLODIPINE BESYLATE 5 MG PO TABS: 5.0000 mg | ORAL_TABLET | Freq: Every day | ORAL | 1 refills | Status: DC

## 2022-04-26 MED ORDER — CITALOPRAM HYDROBROMIDE 20 MG PO TABS: 20.0000 mg | ORAL_TABLET | Freq: Every day | ORAL | 0 refills | Status: DC

## 2022-04-26 MED ORDER — SITAGLIPTIN PHOSPHATE 100 MG PO TABS: ORAL_TABLET | ORAL | 1 refills | Status: DC

## 2022-04-26 NOTE — Progress Notes (Signed)
BP 108/70   Pulse 86   Temp (!) 97.3 F (36.3 C)   Wt 182 lb (82.6 kg)   SpO2 95%   BMI 35.25 kg/m    Subjective:    Patient ID: Ashley Chase, female    DOB: [birth year], 61 y.o.   MRN: [id number]  HPI: Ashley Chase is a 61 y.o. female presenting on 04/26/2022 for Diabetes, Hypertension, Hyperlipidemia, and Hypothyroidism   HPI  Chief Complaint  Patient presents with   Diabetes   Hypertension   Hyperlipidemia   Hypothyroidism     She is having a lot of problems with her mouth.   She went to dentist yesterday.  She goes back on November 7 to get 4 teeth pulled.     She took early retirement.  She starts getting her retirement/social security check soon.      She says her Mood is good.  She is ready to cut back to 20 mg citalopram.     Relevant past medical, surgical, family and social history reviewed and updated as indicated. Interim medical history since our last visit reviewed. Allergies and medications reviewed and updated.   Current Outpatient Medications:    amLODipine (NORVASC) 5 MG tablet, TAKE 1 Tablet BY MOUTH ONCE DAILY, Disp: 90 tablet, Rfl: 1   atorvastatin (LIPITOR) 40 MG tablet, TAKE 1 Tablet BY MOUTH ONCE EVERY DAY, Disp: 90 tablet, Rfl: 1   citalopram (CELEXA) 40 MG tablet, Take 1 tablet by mouth once daily, Disp: 30 tablet, Rfl: 3   insulin glargine (LANTUS SOLOSTAR) 100 UNIT/ML Solostar Pen, Inject 50 Units into the skin at bedtime., Disp: 1 mL, Rfl: 0   JANUVIA 100 MG tablet, TAKE 1 Tablet BY MOUTH ONCE EVERY DAY, Disp: 90 tablet, Rfl: 1   levothyroxine (SYNTHROID) 25 MCG tablet, TAKE 1 Tablet BY MOUTH ONCE EVERY DAY BEFORE BREAKFAST, Disp: 90 tablet, Rfl: 1   lisinopril-hydrochlorothiazide (ZESTORETIC) 20-25 MG tablet, TAKE 1 Tablet BY MOUTH ONCE EVERY DAY, Disp: 90 tablet, Rfl: 1   metFORMIN (GLUCOPHAGE) 500 MG tablet, TAKE 1 Tablet  BY MOUTH TWICE DAILY WITH A MEAL, Disp: 180 tablet, Rfl: 1   metroNIDAZOLE (FLAGYL) 500 MG tablet, Take 500 mg by  mouth 3 (three) times daily., Disp: , Rfl:    naproxen sodium (ALEVE) 220 MG tablet, Take 220 mg by mouth., Disp: , Rfl:    Omega-3 Fatty Acids (FISH OIL) 1000 MG CAPS, 1 po qd, Disp: , Rfl: 0   cetirizine (ZYRTEC) 10 MG tablet, Take 10 mg by mouth daily. (Patient not taking: Reported on 02/22/2022), Disp: , Rfl:    chlorhexidine (PERIDEX) 0.12 % solution, Use as directed 15 mLs in the mouth or throat 2 (two) times daily. (Patient not taking: Reported on 04/26/2022), Disp: 120 mL, Rfl: 1    Review of Systems  Per HPI unless specifically indicated above     Objective:    BP 108/70   Pulse 86   Temp (!) 97.3 F (36.3 C)   Wt 182 lb (82.6 kg)   SpO2 95%   BMI 35.25 kg/m   Wt Readings from Last 3 Encounters:  04/26/22 182 lb (82.6 kg)  02/22/22 185 lb (83.9 kg)  12/29/21 186 lb (84.4 kg)    Physical Exam Vitals reviewed.  Constitutional:      General: She is not in acute distress.    Appearance: She is well-developed. She is not toxic-appearing.  HENT:     Head: Normocephalic and atraumatic.  Cardiovascular:  Rate and Rhythm: Normal rate and regular rhythm.  Pulmonary:     Effort: Pulmonary effort is normal.     Breath sounds: Normal breath sounds.  Abdominal:     General: Bowel sounds are normal.     Palpations: Abdomen is soft. There is no mass.     Tenderness: There is no abdominal tenderness.  Musculoskeletal:     Cervical back: Neck supple.     Right lower leg: No edema.     Left lower leg: No edema.  Lymphadenopathy:     Cervical: No cervical adenopathy.  Skin:    General: Skin is warm and dry.  Neurological:     Mental Status: She is alert and oriented to person, place, and time.  Psychiatric:        Behavior: Behavior normal.        Results for orders placed or performed during the hospital encounter of 04/22/22  Microalbumin, urine  Result Value Ref Range   Microalb, Ur 48.5 (H) Not Estab. ug/mL  Hemoglobin A1c  Result Value Ref Range   Hgb  A1c MFr Bld 7.0 (H) 4.8 - 5.6 %   Mean Plasma Glucose 154.2 mg/dL  TSH  Result Value Ref Range   TSH 3.166 0.350 - 4.500 uIU/mL  Lipid panel  Result Value Ref Range   Cholesterol 134 0 - 200 mg/dL   Triglycerides 75 <150 mg/dL   HDL 38 (L) >40 mg/dL   Total CHOL/HDL Ratio 3.5 RATIO   VLDL 15 0 - 40 mg/dL   LDL Cholesterol 81 0 - 99 mg/dL  Comprehensive metabolic panel  Result Value Ref Range   Sodium 140 135 - 145 mmol/L   Potassium 3.7 3.5 - 5.1 mmol/L   Chloride 103 98 - 111 mmol/L   CO2 28 22 - 32 mmol/L   Glucose, Bld 83 70 - 99 mg/dL   BUN 19 8 - 23 mg/dL   Creatinine, Ser 0.66 0.44 - 1.00 mg/dL   Calcium 9.0 8.9 - 10.3 mg/dL   Total Protein 7.2 6.5 - 8.1 g/dL   Albumin 3.8 3.5 - 5.0 g/dL   AST 17 15 - 41 U/L   ALT 18 0 - 44 U/L   Alkaline Phosphatase 135 (H) 38 - 126 U/L   Total Bilirubin 0.8 0.3 - 1.2 mg/dL   GFR, Estimated >60 >60 mL/min   Anion gap 9 5 - 15         Assessment & Plan:    Encounter Diagnoses  Name Primary?   Controlled diabetes mellitus type 2 with complications, unspecified whether long term insulin use (HCC) Yes   Hyperlipidemia, unspecified hyperlipidemia type    Essential hypertension    Hypothyroidism, unspecified type    Anxiety    Screening for colon cancer    Breast cancer screening by mammogram    Obesity, unspecified classification, unspecified obesity type, unspecified whether serious comorbidity present    Pap smear of cervix declined      -reviewed labs with pt -DM Foot exam was updated -will refer for annual DM Eye exam- -pt was given FIT test for colon cancer screening -pt Declined BHC/counselor -pt was referred for screening Mammogram -pt Declined PAP/cervical screening -will decrease citalopram to 20mg  daily.  All other meds remain unchanged -pt to follow up  3 months.  She is to contact office sooner prn

## 2022-04-27 ENCOUNTER — Other Ambulatory Visit: Payer: Self-pay | Admitting: Physician Assistant

## 2022-04-27 DIAGNOSIS — Z1231 Encounter for screening mammogram for malignant neoplasm of breast: Secondary | ICD-10-CM

## 2022-04-28 ENCOUNTER — Ambulatory Visit: Payer: Self-pay | Admitting: Physician Assistant

## 2022-05-18 ENCOUNTER — Ambulatory Visit (HOSPITAL_COMMUNITY)
Admission: RE | Admit: 2022-05-18 | Discharge: 2022-05-18 | Disposition: A | Discharge status: Home / Self Care | Payer: Self-pay | Source: Ambulatory Visit | Attending: Physician Assistant | Admitting: Physician Assistant

## 2022-05-18 DIAGNOSIS — Z1231 Encounter for screening mammogram for malignant neoplasm of breast: Secondary | ICD-10-CM | POA: Insufficient documentation

## 2022-06-10 ENCOUNTER — Other Ambulatory Visit: Payer: Self-pay | Admitting: Physician Assistant

## 2022-06-17 IMAGING — MG MM DIGITAL SCREENING BILAT W/ TOMO AND CAD
6 of 11 series · 6 of 31 positions shown · non-contrast
Comparison: Previous exam(s).

CLINICAL DATA: Screening.

EXAM:
DIGITAL SCREENING BILATERAL MAMMOGRAM WITH TOMOSYNTHESIS AND CAD
TECHNIQUE: Bilateral screening digital craniocaudal and mediolateral oblique
mammograms were obtained. Bilateral screening digital breast
tomosynthesis was performed. The images were evaluated with
computer-aided detection.

[R CV]
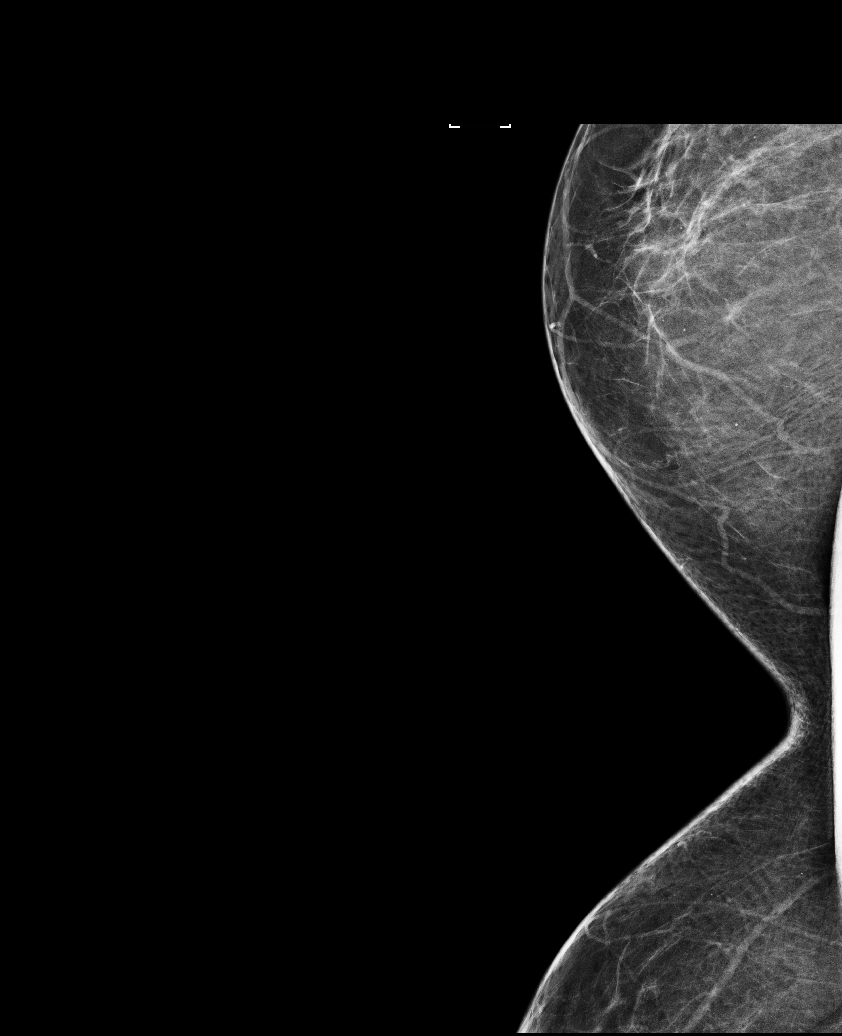

[L CC synth-2D]
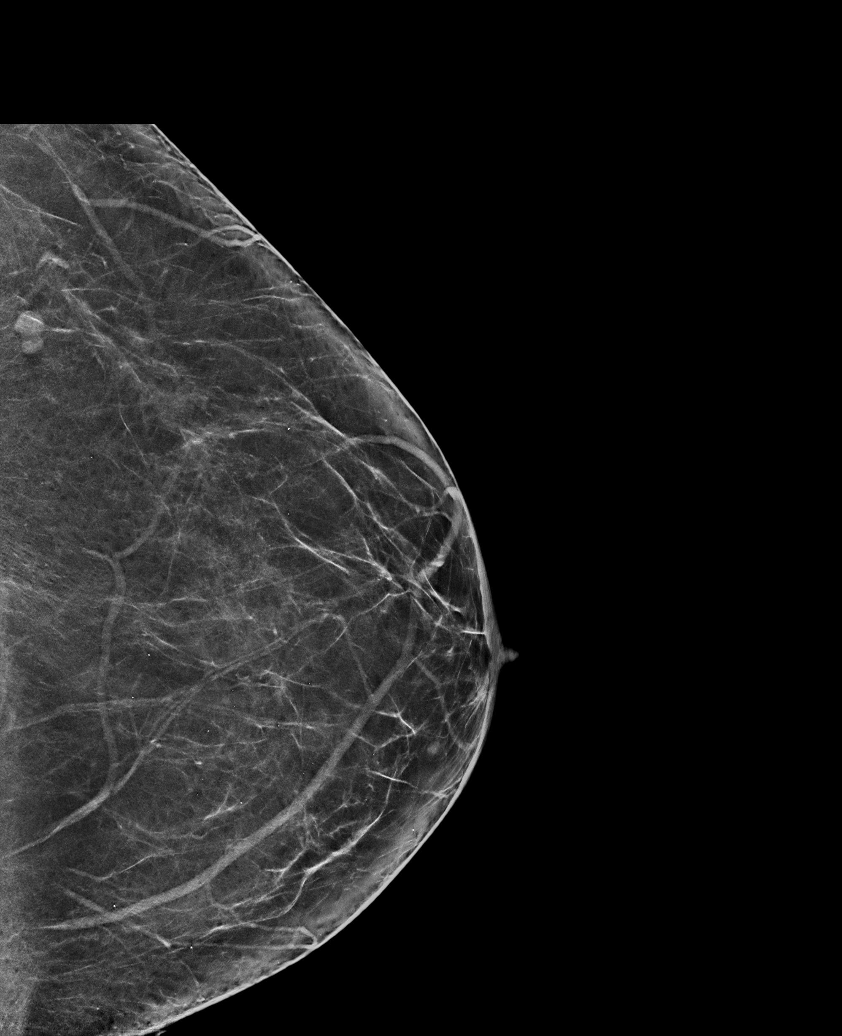

[L MLO synth-2D (1 of 2)]
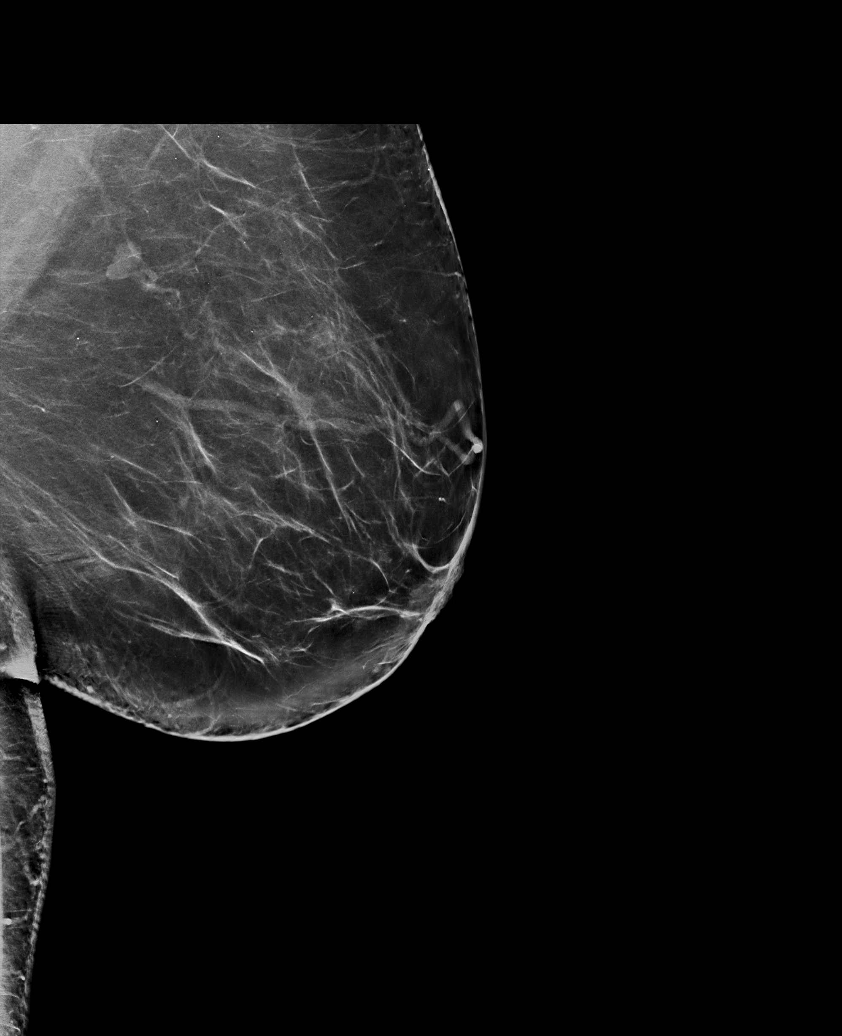

[R CC synth-2D]
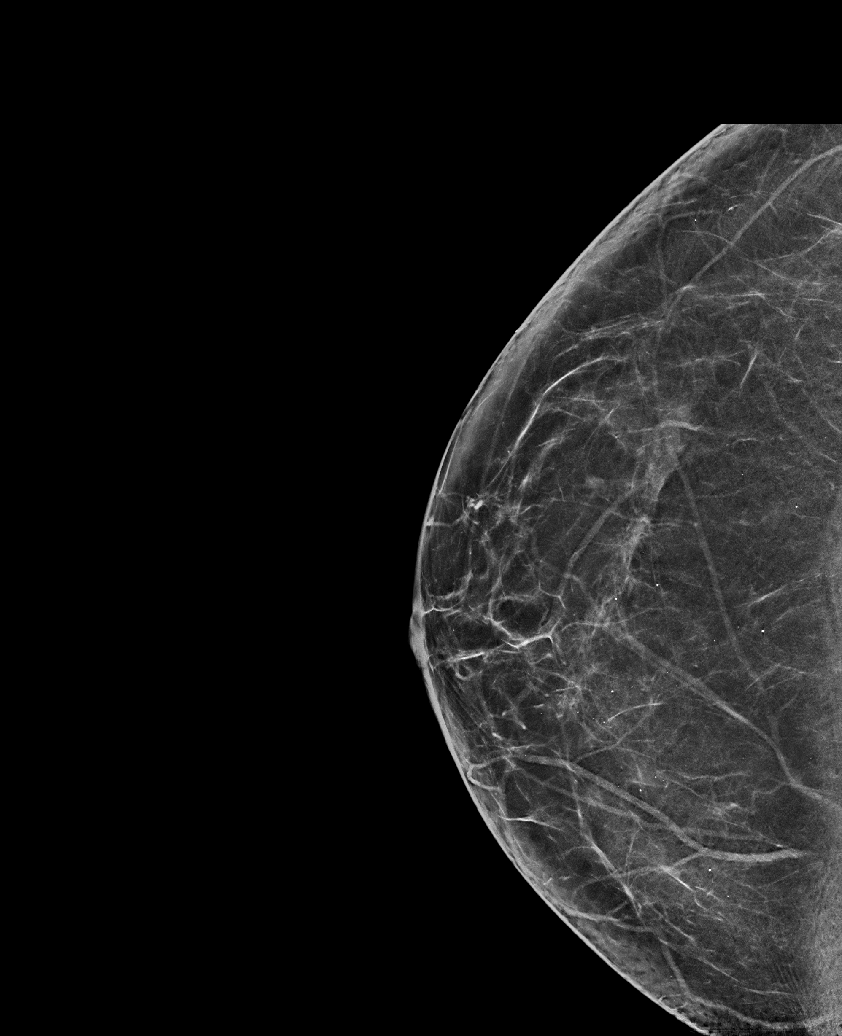

[R MLO synth-2D]
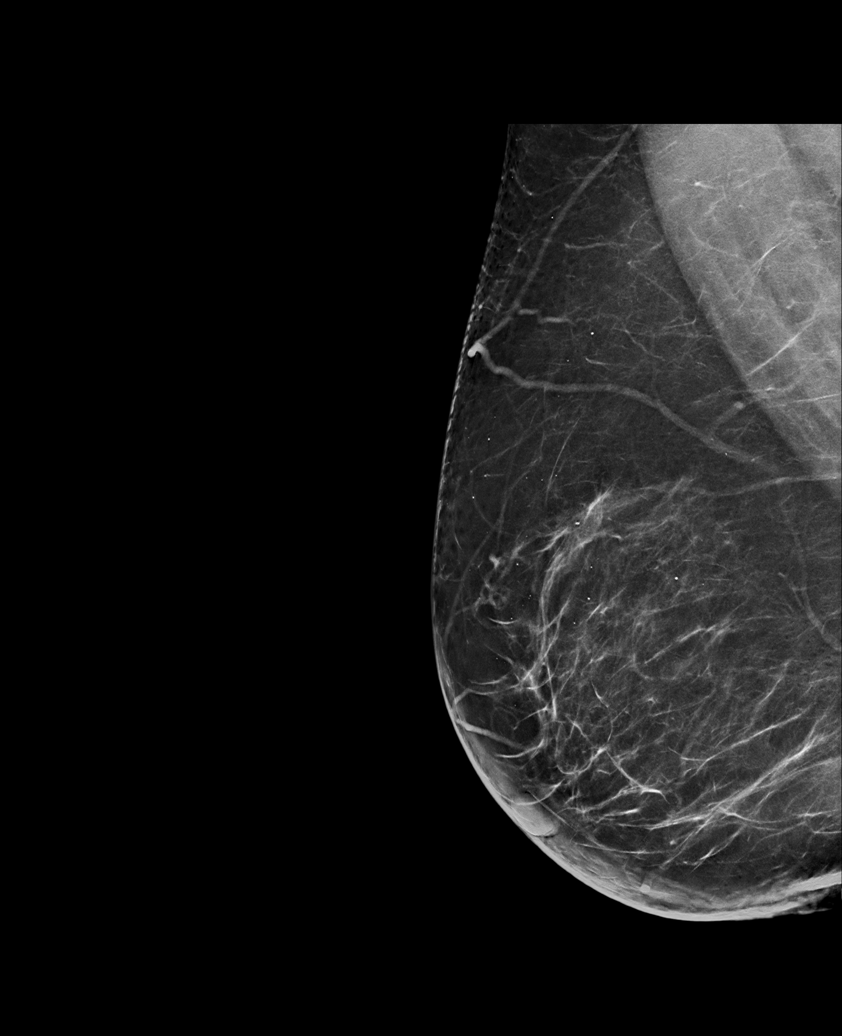

[L MLO synth-2D (2 of 2)]
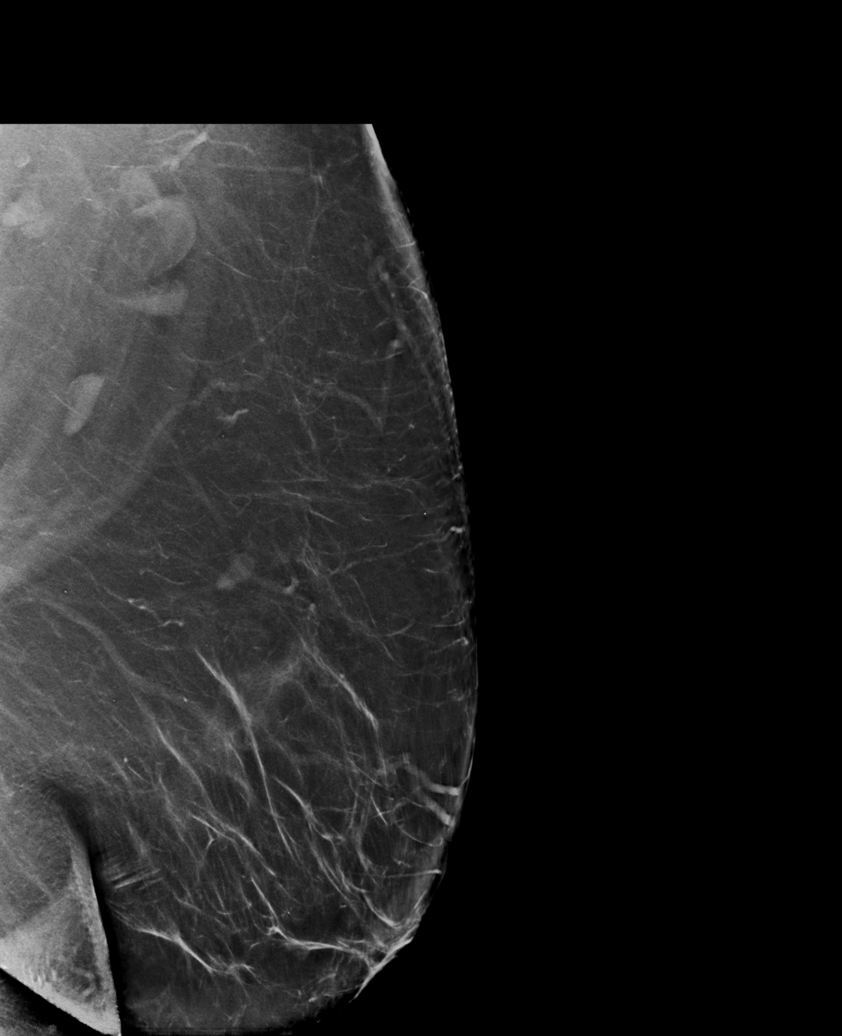

[6 of 31 positions shown; findings below may reference images not displayed]

ACR Breast Density Category b: There are scattered areas of
fibroglandular density.
FINDINGS: There are no findings suspicious for malignancy.
IMPRESSION: No mammographic evidence of malignancy. A result letter of this
screening mammogram will be mailed directly to the patient.

RECOMMENDATION:
Screening mammogram in one year. (Code:51-O-LD2)

BI-RADS CATEGORY  1: Negative.

## 2022-07-06 ENCOUNTER — Other Ambulatory Visit: Payer: Self-pay | Admitting: Physician Assistant

## 2022-07-06 DIAGNOSIS — I10 Essential (primary) hypertension: Secondary | ICD-10-CM

## 2022-07-06 DIAGNOSIS — E785 Hyperlipidemia, unspecified: Secondary | ICD-10-CM

## 2022-07-06 DIAGNOSIS — E118 Type 2 diabetes mellitus with unspecified complications: Secondary | ICD-10-CM

## 2022-07-27 ENCOUNTER — Ambulatory Visit: Payer: Self-pay | Admitting: Physician Assistant

## 2022-08-03 ENCOUNTER — Telehealth: Payer: Self-pay

## 2022-08-03 NOTE — Congregational Nurse Program (Signed)
participant attended the first class of Med instead of Meds phytoRx class with focus "changing proteins" within the diet.   Participant sample recipe  (white bean hummus) that the facilitator created .   She stated she would be willing to re-try the recipe at home with her family  Participant did shop at food market on today as a result of attending class and to begin implementing the practice of  making healthier choices when shopping and considering the mediterrean way of selecting foods   Participant did receive a PhytoRx prescription to utilized for shopping that will assist in management of her diabetes and hypertension  Plan  Ashley Chase plans on attending the next phytorx session on (wed) Feb 7

## 2022-08-03 NOTE — Congregational Nurse Program (Signed)
Pt requested assistance with establishing care with a new Primary Care Provider in addition needed assistance with transfer of her meds from [facility] to the new pharmacy she wishes to use since receiving medicaid  Plan Assisted patient with coordinating her first appointment with [facility] primary [facility] since receiving her medicaid card in the mail by way of [employer].  Pt first appt is established for Feb 13 at 9am by [facility] by phone  Assisted pt with receiving successfully transfer of meds to her newly selected pharmacy

## 2022-08-03 NOTE — Telephone Encounter (Signed)
Assisted patient with providing her with Medicaid Broker/[employer] Medicaid contact information and served as nurse advocate to assist with getting more inforrmation about how to get the provider that she previously requested for her care  Plan -pt was pleased with the assistance she received with the respresentative of [employer] and myself and will be able to see the PCP that she requested when first selecting her Medicaid plan.   Pt has a new patient appointment for Aug 16 2022 @ 9am at [facility] and is satisfied.    Call ended

## 2022-08-10 NOTE — Congregational Nurse Program (Signed)
Participant attended her 2nd class of Med instead of Meds phytoRx class with focus "swapping fats" within the diet on today.   Participant sampled recipe (spicy roasted cauliflower) that the facilitator created .   She stated she would be willing to re-try the recipe at home with her family   Participant did shop at the onsite food market at [employer] on today as a result of attending class and to continue implementing the practice of  making healthier choices when shopping and considering the meditearrean way of selecting foods    Participant continues to utilize her PhytoRx prescription guidelines as it relates to food choices within the market.   Pt shopped for (fruits and vegetables on today   Plan  [place]ner plans on attending the next phytorx session on (wed) Aug 17, 2022

## 2022-08-16 ENCOUNTER — Encounter: Payer: Self-pay | Admitting: Family Medicine

## 2022-08-16 ENCOUNTER — Ambulatory Visit: Payer: Medicaid Other | Admitting: Family Medicine

## 2022-08-16 VITALS — BP 130/79 | HR 80 | Ht 61.0 in | Wt 186.0 lb

## 2022-08-16 DIAGNOSIS — E785 Hyperlipidemia, unspecified: Secondary | ICD-10-CM | POA: Diagnosis not present

## 2022-08-16 DIAGNOSIS — E039 Hypothyroidism, unspecified: Secondary | ICD-10-CM | POA: Diagnosis not present

## 2022-08-16 DIAGNOSIS — Z1159 Encounter for screening for other viral diseases: Secondary | ICD-10-CM

## 2022-08-16 DIAGNOSIS — Z1211 Encounter for screening for malignant neoplasm of colon: Secondary | ICD-10-CM

## 2022-08-16 DIAGNOSIS — E1165 Type 2 diabetes mellitus with hyperglycemia: Secondary | ICD-10-CM

## 2022-08-16 DIAGNOSIS — I10 Essential (primary) hypertension: Secondary | ICD-10-CM | POA: Diagnosis not present

## 2022-08-16 DIAGNOSIS — E118 Type 2 diabetes mellitus with unspecified complications: Secondary | ICD-10-CM | POA: Diagnosis not present

## 2022-08-16 DIAGNOSIS — Z114 Encounter for screening for human immunodeficiency virus [HIV]: Secondary | ICD-10-CM

## 2022-08-16 DIAGNOSIS — R7301 Impaired fasting glucose: Secondary | ICD-10-CM

## 2022-08-16 DIAGNOSIS — Z794 Long term (current) use of insulin: Secondary | ICD-10-CM

## 2022-08-16 DIAGNOSIS — E119 Type 2 diabetes mellitus without complications: Secondary | ICD-10-CM | POA: Insufficient documentation

## 2022-08-16 MED ORDER — LANTUS SOLOSTAR 100 UNIT/ML ~~LOC~~ SOPN: 50.0000 [IU] | PEN_INJECTOR | Freq: Every day | SUBCUTANEOUS | 3 refills | Status: DC

## 2022-08-16 NOTE — Assessment & Plan Note (Addendum)
Labs ordered, Blood pressure well controlled 128,130/79 , Continue Norvasc 5 mg and Lisinopril-hydrochlorothiazide 20-25 mg Discussed Non Pharmacological interventions such as low salt, DASH diet discussed. Educated on stress reduction and physical activity. Discussed signs and symptoms of major cardiovascular event and need to present to the ED. Follow up in 3 months or sooner if needed. Patient verbalizes understanding regarding plan of care and all questions answered.

## 2022-08-16 NOTE — Progress Notes (Signed)
New Patient Office Visit   Subjective   Patient ID: Ashley Chase, female    DOB: 1960-10-24  Age: 62 y.o. MRN: [id number]  CC:  Chief Complaint  Patient presents with   Establish Care    HPI Ashley Chase 62 year old female, presents to establish care. She  has a past medical history of Depression, Diabetes mellitus without complication ([place]), Hyperlipidemia, and Hypertension.  Patient here for hypertension managment. She is exercising, walks daily one mile a day and is adherent to low salt diet.  Blood pressure is well controlled at home. Patient denies cardiac symptoms chest pain, dyspnea, palpitations, syncope, and tachypnea. Patient reports daytime fatigue.  Cardiovascular risk factors include diabetes mellitus, dyslipidemia, hypertension, and obesity (BMI >= 30 kg/m2).   Diabetes She presents for her follow-up diabetic visit. She has type 2 diabetes mellitus. No MedicAlert identification noted. Her disease course has been improving. Pertinent negatives for hypoglycemia include no confusion, dizziness, headaches, mood changes, nervousness/anxiousness, speech difficulty or sweats. Pertinent negatives for diabetes include no blurred vision, no chest pain, no foot paresthesias, no foot ulcerations, no polydipsia, no polyphagia, no polyuria and no visual change. Symptoms are stable. Diabetic complications include peripheral neuropathy. Risk factors for coronary artery disease include diabetes mellitus, dyslipidemia, hypertension and obesity. Current diabetic treatment includes insulin injections and oral agent (dual therapy). She is compliant with treatment all of the time. She is currently taking insulin at bedtime. Insulin injections are given by patient. Rotation sites for injection include the abdominal wall. Her weight is fluctuating minimally. She is following a low fat/cholesterol and low salt diet. Meal planning includes avoidance of concentrated sweets and calorie counting. She has not had  a previous visit with a dietitian. She participates in exercise three times a week. Her home blood glucose trend is fluctuating minimally (Last glucose readings include: 132,152,113,116,97). An ACE inhibitor/angiotensin II receptor blocker is being taken. She does not see a podiatrist.Eye exam is not current.      Outpatient Encounter Medications as of 08/16/2022  Medication Sig   amLODipine (NORVASC) 5 MG tablet Take 1 tablet (5 mg total) by mouth daily.   atorvastatin (LIPITOR) 40 MG tablet TAKE 1 Tablet BY MOUTH ONCE EVERY DAY   cetirizine (ZYRTEC) 10 MG tablet Take 10 mg by mouth daily.   chlorhexidine (PERIDEX) 0.12 % solution Use as directed 15 mLs in the mouth or throat 2 (two) times daily.   citalopram (CELEXA) 20 MG tablet TAKE 1 Tablet BY MOUTH ONCE EVERY DAY   levothyroxine (SYNTHROID) 25 MCG tablet TAKE 1 Tablet BY MOUTH ONCE EVERY DAY BEFORE BREAKFAST   lisinopril-hydrochlorothiazide (ZESTORETIC) 20-25 MG tablet TAKE 1 Tablet BY MOUTH ONCE EVERY DAY   metFORMIN (GLUCOPHAGE) 500 MG tablet TAKE 1 Tablet  BY MOUTH TWICE DAILY WITH A MEAL   naproxen sodium (ALEVE) 220 MG tablet Take 220 mg by mouth.   Omega-3 Fatty Acids (FISH OIL) 1000 MG CAPS 1 po qd   sitaGLIPtin (JANUVIA) 100 MG tablet TAKE 1 Tablet BY MOUTH ONCE EVERY DAY   [DISCONTINUED] insulin glargine (LANTUS SOLOSTAR) 100 UNIT/ML Solostar Pen Inject 50 Units into the skin at bedtime.   insulin glargine (LANTUS SOLOSTAR) 100 UNIT/ML Solostar Pen Inject 50 Units into the skin at bedtime.   metroNIDAZOLE (FLAGYL) 500 MG tablet Take 500 mg by mouth 3 (three) times daily.   No facility-administered encounter medications on file as of 08/16/2022.    Past Surgical History:  Procedure Laterality Date   ECTOPIC PREGNANCY  SURGERY     one ovary removed    Review of Systems  Constitutional:  Negative for chills and fever.  Eyes:  Negative for blurred vision.  Respiratory:  Negative for shortness of breath.   Cardiovascular:   Negative for chest pain.  Gastrointestinal:  Negative for abdominal pain and vomiting.  Genitourinary:  Negative for dysuria, frequency, hematuria and urgency.  Neurological:  Negative for dizziness, speech difficulty and headaches.  Endo/Heme/Allergies:  Negative for polydipsia and polyphagia.  Psychiatric/Behavioral:  Negative for confusion and depression. The patient is not nervous/anxious.       Objective    BP 130/79   Pulse 80   Ht 5' 1"$  (1.549 m)   Wt 186 lb (84.4 kg)   SpO2 97%   BMI 35.14 kg/m   Physical Exam Vitals reviewed.  Constitutional:      Appearance: Normal appearance.  Cardiovascular:     Rate and Rhythm: Normal rate and regular rhythm.     Pulses: Normal pulses.     Heart sounds: No murmur heard. Pulmonary:     Effort: Pulmonary effort is normal. No respiratory distress.     Breath sounds: Normal breath sounds.  Musculoskeletal:        General: No swelling. Normal range of motion.     Cervical back: Normal range of motion.     Right lower leg: No edema.     Left lower leg: No edema.  Skin:    General: Skin is warm and dry.     Capillary Refill: Capillary refill takes less than 2 seconds.  Neurological:     General: No focal deficit present.     Mental Status: She is alert.     Motor: No weakness.     Coordination: Coordination normal.     Gait: Gait normal.     Deep Tendon Reflexes: Reflexes normal.  Psychiatric:        Mood and Affect: Mood normal.       Assessment & Plan:  Controlled diabetes mellitus type 2 with complications, unspecified whether long term insulin use (HCC)  Need for hepatitis C screening test -     Hepatitis C antibody  Screening for HIV (human immunodeficiency virus) -     HIV Antibody (routine testing w rflx)  Primary hypertension -     Microalbumin / creatinine urine ratio  Hypothyroidism, unspecified type -     TSH + free T4  Hyperlipidemia, unspecified hyperlipidemia type -     Lipid panel -      CMP14+EGFR -     CBC with Differential/Platelet  IFG (impaired fasting glucose) -     Hemoglobin A1c  Screening for colon cancer -     Fecal occult blood, imunochemical  Encounter for diabetes type 2 eye exam ([place]) -     Ambulatory referral to Ophthalmology  Uncontrolled type 2 diabetes mellitus with hyperglycemia ([place]) Assessment & Plan:     Type 2 diabetes mellitus without complication, with long-term current use of insulin (HCC) Assessment & Plan: Labs ordered, Hemoglobin A1C 7.0 at goal, Discussed medication desired effects, potential side effects and how to administer the medication. Nonpharmacological interventions such as low carb diet,high in vegetables and fruit discussed. Educated on importance of physical activity. Discussed signs and symptoms of hypoglycemia and need to present to the ED.  Follow up in 3 months or sooner if needed. Patient verbalizes understanding regarding plan of care and all questions answered. Ophthalmology referral placed , Foot exam within  desired limits  Continue Metformin 500 mg twice daily, Januvia 100 mg once daily, insulin glargine 50 units injection at bed time   Essential hypertension Assessment & Plan: Labs ordered, Blood pressure well controlled 128,130/79 , Continue Norvasc 5 mg and Lisinopril-hydrochlorothiazide 20-25 mg Discussed Non Pharmacological interventions such as low salt, DASH diet discussed. Educated on stress reduction and physical activity. Discussed signs and symptoms of major cardiovascular event and need to present to the ED. Follow up in 3 months or sooner if needed. Patient verbalizes understanding regarding plan of care and all questions answered.    Other orders -     Lantus SoloStar; Inject 50 Units into the skin at bedtime.  Dispense: 15 mL; Refill: 3    Return in about 3 months (around 11/14/2022) for chronic follow-up , HTN, type 2 diabetes, Hyperlidemia .   Renard Hamper Ria Comment, FNP

## 2022-08-16 NOTE — Patient Instructions (Signed)
It was pleasure meeting with you today. Please take medications as prescribed. Follow up with your primary health provider if any health concerns arises. If symptoms worsen please contact your primary care provider and/or visit the emergency department.  Follow up in 3 months for chronic management, HTN, TYPE 2 DIABETES

## 2022-08-16 NOTE — Assessment & Plan Note (Signed)
Labs ordered, Hemoglobin A1C 7.0 at goal, Discussed medication desired effects, potential side effects and how to administer the medication. Nonpharmacological interventions such as low carb diet,high in vegetables and fruit discussed. Educated on importance of physical activity. Discussed signs and symptoms of hypoglycemia and need to present to the ED.  Follow up in 3 months or sooner if needed. Patient verbalizes understanding regarding plan of care and all questions answered. Ophthalmology referral placed , Foot exam within desired limits  Continue Metformin 500 mg twice daily, Januvia 100 mg once daily, insulin glargine 50 units injection at bed time

## 2022-08-17 ENCOUNTER — Other Ambulatory Visit: Payer: Self-pay | Admitting: Family Medicine

## 2022-08-17 ENCOUNTER — Telehealth: Payer: Self-pay | Admitting: Family Medicine

## 2022-08-17 DIAGNOSIS — E119 Type 2 diabetes mellitus without complications: Secondary | ICD-10-CM

## 2022-08-17 MED ORDER — LANTUS SOLOSTAR 100 UNIT/ML ~~LOC~~ SOPN: 50.0000 [IU] | PEN_INJECTOR | Freq: Every day | SUBCUTANEOUS | 3 refills | Status: DC

## 2022-08-17 NOTE — Telephone Encounter (Signed)
Patient notified

## 2022-08-17 NOTE — Addendum Note (Signed)
Addended by: Juanda Chance on: 08/17/2022 03:44 PM   Modules accepted: Orders

## 2022-08-17 NOTE — Telephone Encounter (Signed)
Fixed I sent it electronically

## 2022-08-17 NOTE — Congregational Nurse Program (Signed)
Participant attended her 4th session of Eating the Med Way instead of Meds phytoRx class with the focus "eating more fruits and vegetables" within the diet.  Participant sampled recipe (fruit chopt salad) that the facilitator create and related to the class topic.  She states it was very good and that she would be willing to try the recipe at home for herself and family.   Participant chose not to shop at the onsite food market at [employer] on today as a result of attending class but does continue to implement the practice of  making healthier choices at her home and when shopping in the grocery store for selected foods   Plan   Derin plans on attending the next phytorx session  on (wed) Aug 24, 2022

## 2022-08-17 NOTE — Telephone Encounter (Signed)
Please let patient know which pharmacy should I send her medications

## 2022-08-17 NOTE — Addendum Note (Signed)
Addended by: Juanda Chance on: 08/17/2022 03:53 PM   Modules accepted: Orders

## 2022-08-17 NOTE — Telephone Encounter (Signed)
Patient called said just seen the provider yesterday and was gong to send in some medicines at [facility] in [place].  Patient called pharmacy and has not receive the medication. Please return patient call.

## 2022-08-18 DIAGNOSIS — R7301 Impaired fasting glucose: Secondary | ICD-10-CM | POA: Diagnosis not present

## 2022-08-18 DIAGNOSIS — E039 Hypothyroidism, unspecified: Secondary | ICD-10-CM | POA: Diagnosis not present

## 2022-08-18 DIAGNOSIS — I10 Essential (primary) hypertension: Secondary | ICD-10-CM | POA: Diagnosis not present

## 2022-08-18 DIAGNOSIS — Z114 Encounter for screening for human immunodeficiency virus [HIV]: Secondary | ICD-10-CM | POA: Diagnosis not present

## 2022-08-18 DIAGNOSIS — E785 Hyperlipidemia, unspecified: Secondary | ICD-10-CM | POA: Diagnosis not present

## 2022-08-18 DIAGNOSIS — Z1159 Encounter for screening for other viral diseases: Secondary | ICD-10-CM | POA: Diagnosis not present

## 2022-08-20 LAB — CBC WITH DIFFERENTIAL/PLATELET
Basophils Absolute: 0.1 10*3/uL (ref 0.0–0.2)
Basos: 1 %
EOS (ABSOLUTE): 0.2 10*3/uL (ref 0.0–0.4)
Eos: 3 %
Hematocrit: 37.8 % (ref 34.0–46.6)
Hemoglobin: 12.4 g/dL (ref 11.1–15.9)
Immature Grans (Abs): 0 10*3/uL (ref 0.0–0.1)
Immature Granulocytes: 0 %
Lymphocytes Absolute: 1.9 10*3/uL (ref 0.7–3.1)
Lymphs: 23 %
MCH: 26.6 pg (ref 26.6–33.0)
MCHC: 32.8 g/dL (ref 31.5–35.7)
MCV: 81 fL (ref 79–97)
Monocytes Absolute: 0.5 10*3/uL (ref 0.1–0.9)
Monocytes: 6 %
Neutrophils Absolute: 5.5 10*3/uL (ref 1.4–7.0)
Neutrophils: 67 %
Platelets: 204 10*3/uL (ref 150–450)
RBC: 4.67 x10E6/uL (ref 3.77–5.28)
RDW: 14.2 % (ref 11.7–15.4)
WBC: 8.1 10*3/uL (ref 3.4–10.8)

## 2022-08-20 LAB — MICROALBUMIN / CREATININE URINE RATIO
Creatinine, Urine: 106.8 mg/dL
Microalb/Creat Ratio: 23 mg/g creat (ref 0–29)
Microalbumin, Urine: 24.1 ug/mL

## 2022-08-20 LAB — CMP14+EGFR
ALT: 12 IU/L (ref 0–32)
AST: 12 IU/L (ref 0–40)
Albumin/Globulin Ratio: 1.8 (ref 1.2–2.2)
Albumin: 4.4 g/dL (ref 3.9–4.9)
Alkaline Phosphatase: 167 IU/L — ABNORMAL HIGH (ref 44–121)
BUN/Creatinine Ratio: 19 (ref 12–28)
BUN: 13 mg/dL (ref 8–27)
Bilirubin Total: 0.3 mg/dL (ref 0.0–1.2)
CO2: 26 mmol/L (ref 20–29)
Calcium: 9.5 mg/dL (ref 8.7–10.3)
Chloride: 101 mmol/L (ref 96–106)
Creatinine, Ser: 0.68 mg/dL (ref 0.57–1.00)
Globulin, Total: 2.5 g/dL (ref 1.5–4.5)
Glucose: 133 mg/dL — ABNORMAL HIGH (ref 70–99)
Potassium: 3.9 mmol/L (ref 3.5–5.2)
Sodium: 142 mmol/L (ref 134–144)
Total Protein: 6.9 g/dL (ref 6.0–8.5)
eGFR: 98 mL/min/{1.73_m2} (ref >59)

## 2022-08-20 LAB — LIPID PANEL
Chol/HDL Ratio: 3.7 ratio (ref 0.0–4.4)
Cholesterol, Total: 154 mg/dL (ref 100–199)
HDL: 42 mg/dL (ref >39)
LDL Chol Calc (NIH): 88 mg/dL (ref 0–99)
Triglycerides: 135 mg/dL (ref 0–149)
VLDL Cholesterol Cal: 24 mg/dL (ref 5–40)

## 2022-08-20 LAB — HEMOGLOBIN A1C
Est. average glucose Bld gHb Est-mCnc: 180 mg/dL
Hgb A1c MFr Bld: 7.9 % — ABNORMAL HIGH (ref 4.8–5.6)

## 2022-08-20 LAB — TSH+FREE T4
Free T4: 1.14 ng/dL (ref 0.82–1.77)
TSH: 4.26 u[IU]/mL (ref 0.450–4.500)

## 2022-08-20 LAB — HIV ANTIBODY (ROUTINE TESTING W REFLEX): HIV Screen 4th Generation wRfx: NONREACTIVE

## 2022-08-20 LAB — HEPATITIS C ANTIBODY: Hep C Virus Ab: NONREACTIVE

## 2022-08-28 ENCOUNTER — Other Ambulatory Visit: Payer: Self-pay | Admitting: Family Medicine

## 2022-08-28 MED ORDER — METFORMIN HCL 1000 MG PO TABS: 1000.0000 mg | ORAL_TABLET | Freq: Two times a day (BID) | ORAL | 3 refills | Status: DC

## 2022-08-31 LAB — GLUCOSE, POCT (MANUAL RESULT ENTRY): POC Glucose: 101 mg/dl — AB (ref 70–99)

## 2022-09-01 ENCOUNTER — Encounter: Payer: Self-pay | Admitting: Radiology

## 2022-09-07 DIAGNOSIS — Z139 Encounter for screening, unspecified: Secondary | ICD-10-CM

## 2022-09-07 NOTE — Congregational Nurse Program (Signed)
Participant attended final session 6 of Eating the Med Way instead of Meds phytoRx class with the focus "re-thinking your sweets" within the diet.  Participant sampled recipe for a healthy sweet substitute snack (microwaveble baked apple) that the facilitator created and related to the class topic. She states it was very good and felt that he would be willing to try the recipe at home for herself and the family.   Participant continues to shop at the onsite food market on a weekly basis and does continue to implement the practice of  making healthier choices at his home and when shopping in the grocery store for selected foods   She received a certificate of completion and a healthy incentive item to assist with continuing to make healthier food choices in the future  Participant participated in the nurse hosted health screenings for her blood pressure and blood glucose.  In addition received education and counseling regarding the screening results.  Both BS (nonfasting)= and BP were WNL  Pt admits to both history of diabetes and hypertension.    Pt stated that she has been improving upon her diet an exercising to which she believes has benefited her diabetes and high blood pressure greatly... States she has enjoyed learning  new health nutritional information by way of phytorx and that she implements these things into her diet at home   Pt stated she understood the instructions and states she will attempt to improve

## 2022-10-13 ENCOUNTER — Telehealth: Payer: Self-pay | Admitting: Family Medicine

## 2022-10-13 MED ORDER — CITALOPRAM HYDROBROMIDE 20 MG PO TABS: ORAL_TABLET | ORAL | 1 refills | Status: DC

## 2022-10-13 NOTE — Telephone Encounter (Signed)
Prescription Request  10/13/2022  LOV: 08/16/2022  What is the name of the medication or equipment? citalopram (CELEXA) 20 MG tablet   Have you contacted your pharmacy to request a refill? Yes   Which pharmacy would you like this sent to?  [facility] [address] [place] - [address] [place] [address] Phone: [phone] Fax: [phone]  [name], [place] - [address], [place] [address], [place] [address] [place] [address] Phone: [phone] Fax: [phone]    Patient notified that their request is being sent to the clinical staff for review and that they should receive a response within 2 business days.   Please advise at [facility] [phone]   Patient is also requesting script for glucose monitor kit  w. Supplies

## 2022-10-13 NOTE — Telephone Encounter (Signed)
New message    1. Which medications need to be refilled? (please list name of each medication and dose if known)  glucose monitor kit with supplies   2. Which pharmacy/location (including street and city if local pharmacy) is medication to be sent to? [facility] in [place]    3. Do they need a 30 day or 90 day supply? 30 days supply

## 2022-10-16 ENCOUNTER — Other Ambulatory Visit: Payer: Self-pay | Admitting: Family Medicine

## 2022-10-16 MED ORDER — BLOOD GLUCOSE TEST VI STRP: 1.0000 | ORAL_STRIP | Freq: Three times a day (TID) | 3 refills | Status: DC

## 2022-10-16 MED ORDER — LANCETS MISC. MISC: 1.0000 | Freq: Three times a day (TID) | 0 refills | Status: AC

## 2022-10-16 MED ORDER — FREESTYLE LANCETS MISC: 12 refills | Status: DC

## 2022-10-16 MED ORDER — LANCET DEVICE MISC: 1.0000 | Freq: Three times a day (TID) | 0 refills | Status: AC

## 2022-10-16 MED ORDER — BLOOD GLUCOSE MONITORING SUPPL DEVI: 1.0000 | Freq: Three times a day (TID) | 0 refills | Status: AC

## 2022-10-16 NOTE — Telephone Encounter (Signed)
Sent!

## 2022-11-09 NOTE — Patient Instructions (Signed)

## 2022-11-14 ENCOUNTER — Ambulatory Visit: Payer: Medicaid Other | Admitting: Family Medicine

## 2022-11-15 ENCOUNTER — Encounter: Payer: Self-pay | Admitting: Family Medicine

## 2022-11-15 ENCOUNTER — Ambulatory Visit: Payer: Medicaid Other | Admitting: Family Medicine

## 2022-11-15 VITALS — BP 120/75 | HR 75 | Ht 61.0 in | Wt 184.0 lb

## 2022-11-15 DIAGNOSIS — Z794 Long term (current) use of insulin: Secondary | ICD-10-CM

## 2022-11-15 DIAGNOSIS — M25561 Pain in right knee: Secondary | ICD-10-CM | POA: Diagnosis not present

## 2022-11-15 DIAGNOSIS — G8929 Other chronic pain: Secondary | ICD-10-CM

## 2022-11-15 DIAGNOSIS — E119 Type 2 diabetes mellitus without complications: Secondary | ICD-10-CM | POA: Diagnosis not present

## 2022-11-15 DIAGNOSIS — E559 Vitamin D deficiency, unspecified: Secondary | ICD-10-CM

## 2022-11-15 DIAGNOSIS — E782 Mixed hyperlipidemia: Secondary | ICD-10-CM

## 2022-11-15 DIAGNOSIS — Z13220 Encounter for screening for lipoid disorders: Secondary | ICD-10-CM | POA: Diagnosis not present

## 2022-11-15 DIAGNOSIS — Z1211 Encounter for screening for malignant neoplasm of colon: Secondary | ICD-10-CM

## 2022-11-15 DIAGNOSIS — I10 Essential (primary) hypertension: Secondary | ICD-10-CM | POA: Diagnosis not present

## 2022-11-15 NOTE — Assessment & Plan Note (Signed)
Right knee Xray ordered- awaiting results will follow up Explained to patient Non pharmacological interventions include the use of ice or heat, rest, recommend range of motion exercises, gentle stretching. The use of NSAIDs for pain management.  Follow up for worsening or persistent symptoms. Patient verbalizes understanding regarding plan of care and all questions answered.

## 2022-11-15 NOTE — Assessment & Plan Note (Signed)
LDL 88, with LDL <70, Patient reported taking Lipitor 40 mg daily, Labs ordered- awaiting results will follow up.  Continued discussion adhering to low-fat diet with strong emphasize on nutrition and exercise. Read food labels to know how many calories are in each serving , Increase water intake 2-3 L a day. Include more protein intake such as lean meat, poultry, fish. Increase fiber intake such as vegetables, whole grains, fruits, artichokes, green peas, broccoli, lentils and lima beans.

## 2022-11-15 NOTE — Assessment & Plan Note (Signed)
Labs ordered today- awaiting results will follow up. Last hemoglobin A1c reading 7.9 not at goal. Patient reported taking metformin 1000 mg twice daily,  Januvia 100 mg daily, and insuline glargine 50 units at bedtime. Discussed medication desired effects, potential side effects, and how to administer the medication. Nonpharmacological interventions such as low carb diet,high in protein, vegetables and fruit discussed. Educated on importance of physical activity. Discussed signs and symptoms of hypoglycemia and need to present to the ED. Follow up in 3 months or sooner if needed. Patient verbalizes understanding regarding plan of care and all questions answered. Ophthalmology exam up to date foot exam within desired limits

## 2022-11-15 NOTE — Assessment & Plan Note (Addendum)
Vitals:   11/15/22 0827 11/15/22 0850  BP: 124/77 120/75  Patient blood pressure within desired limits in today's visit Continue amlodipine 5 mg and lisinopril-hctz 20-25 mg daily Continued discussion on DASH diet, low sodium diet and maintain a exercise routine for 150 minutes per week.

## 2022-11-15 NOTE — Progress Notes (Signed)
Patient Office Visit   Subjective   Patient ID: Ashley Chase, female    DOB: 1961/03/04  Age: 62 y.o. MRN: [id number]  CC:  Chief Complaint  Patient presents with   Chronic Care Management    3 month f/u for htn, dm2 and hyperlipidemia.    Cyst    Pt reports a lumpy knot on the back of her right upper leg since 11/14/2021.     HPI Ashley Chase 62 year old female, presents to the clinic for chronic follow up She  has a past medical history of Depression, Diabetes mellitus without complication (HCC), Hyperlipidemia, and Hypertension.For the details of today's visit, please refer to assessment and plan.   Knee Pain  There was no injury mechanism. The pain is present in the right knee. The quality of the pain is described as aching and burning. The pain is at a severity of 9/10. The pain has been Fluctuating since onset. Associated symptoms include a loss of motion and muscle weakness. Pertinent negatives include no loss of sensation, numbness or tingling. The symptoms are aggravated by movement and weight bearing. She has tried rest, ice and non-weight bearing for the symptoms. The treatment provided mild relief.      Outpatient Encounter Medications as of 11/15/2022  Medication Sig   amLODipine (NORVASC) 5 MG tablet Take 1 tablet (5 mg total) by mouth daily.   atorvastatin (LIPITOR) 40 MG tablet TAKE 1 Tablet BY MOUTH ONCE EVERY DAY   Blood Glucose Monitoring Suppl DEVI 1 each by Does not apply route in the morning, at noon, and at bedtime. May substitute to any manufacturer covered by patient's insurance.   cetirizine (ZYRTEC) 10 MG tablet Take 10 mg by mouth daily.   chlorhexidine (PERIDEX) 0.12 % solution Use as directed 15 mLs in the mouth or throat 2 (two) times daily.   citalopram (CELEXA) 20 MG tablet TAKE 1 Tablet BY MOUTH ONCE EVERY DAY   Glucose Blood (BLOOD GLUCOSE TEST STRIPS) STRP 1 each by In Vitro route in the morning, at noon, and at bedtime. May substitute to any  manufacturer covered by patient's insurance.   insulin glargine (LANTUS SOLOSTAR) 100 UNIT/ML Solostar Pen Inject 50 Units into the skin at bedtime.   Lancet Device MISC 1 each by Does not apply route in the morning, at noon, and at bedtime. May substitute to any manufacturer covered by patient's insurance.   Lancets (FREESTYLE) lancets Use as instructed   Lancets Misc. MISC 1 each by Does not apply route in the morning, at noon, and at bedtime. May substitute to any manufacturer covered by patient's insurance.   levothyroxine (SYNTHROID) 25 MCG tablet TAKE 1 Tablet BY MOUTH ONCE EVERY DAY BEFORE BREAKFAST   lisinopril-hydrochlorothiazide (ZESTORETIC) 20-25 MG tablet TAKE 1 Tablet BY MOUTH ONCE EVERY DAY   metFORMIN (GLUCOPHAGE) 1000 MG tablet Take 1 tablet (1,000 mg total) by mouth 2 (two) times daily with a meal.   Omega-3 Fatty Acids (FISH OIL) 1000 MG CAPS 1 po qd   sitaGLIPtin (JANUVIA) 100 MG tablet TAKE 1 Tablet BY MOUTH ONCE EVERY DAY   [DISCONTINUED] metroNIDAZOLE (FLAGYL) 500 MG tablet Take 500 mg by mouth 3 (three) times daily.   No facility-administered encounter medications on file as of 11/15/2022.    Past Surgical History:  Procedure Laterality Date   ECTOPIC PREGNANCY SURGERY     one ovary removed    Review of Systems  Constitutional:  Negative for chills and fever.  Respiratory:  Negative for shortness of breath.   Cardiovascular:  Negative for chest pain.  Gastrointestinal:  Negative for abdominal pain, nausea and vomiting.  Genitourinary:  Negative for dysuria.  Neurological:  Negative for dizziness, tingling, numbness and headaches.      Objective    BP 120/75   Pulse 75   Ht 5\' 1"  (1.549 m)   Wt 184 lb (83.5 kg)   SpO2 96%   BMI 34.77 kg/m   Physical Exam Vitals reviewed.  Constitutional:      General: She is not in acute distress.    Appearance: Normal appearance. She is not ill-appearing, toxic-appearing or diaphoretic.  HENT:     Head:  Normocephalic.  Eyes:     General:        Right eye: No discharge.        Left eye: No discharge.     Conjunctiva/sclera: Conjunctivae normal.  Cardiovascular:     Rate and Rhythm: Normal rate.     Pulses: Normal pulses.     Heart sounds: Normal heart sounds.  Pulmonary:     Effort: Pulmonary effort is normal. No respiratory distress.     Breath sounds: Normal breath sounds.  Abdominal:     General: Bowel sounds are normal.     Palpations: Abdomen is soft.     Tenderness: There is no abdominal tenderness. There is no right CVA tenderness, left CVA tenderness or guarding.  Musculoskeletal:        General: No swelling or tenderness. Normal range of motion.     Cervical back: Normal range of motion.     Right knee: Normal. No swelling. Normal range of motion. No tenderness.     Left knee: Normal. No swelling. Normal range of motion. No tenderness.  Skin:    General: Skin is warm and dry.     Capillary Refill: Capillary refill takes less than 2 seconds.  Neurological:     General: No focal deficit present.     Mental Status: She is alert and oriented to person, place, and time.     Coordination: Coordination normal.     Gait: Gait normal.  Psychiatric:        Mood and Affect: Mood normal.        Behavior: Behavior normal.       Assessment & Plan:  Screening for colon cancer  Type 2 diabetes mellitus without complication, with long-term current use of insulin (HCC) Assessment & Plan: Labs ordered today- awaiting results will follow up. Last hemoglobin A1c reading 7.9 not at goal. Patient reported taking metformin 1000 mg twice daily,  Januvia 100 mg daily, and insuline glargine 50 units at bedtime. Discussed medication desired effects, potential side effects, and how to administer the medication. Nonpharmacological interventions such as low carb diet,high in protein, vegetables and fruit discussed. Educated on importance of physical activity. Discussed signs and symptoms of  hypoglycemia and need to present to the ED. Follow up in 3 months or sooner if needed. Patient verbalizes understanding regarding plan of care and all questions answered. Ophthalmology exam up to date foot exam within desired limits   Orders: -     Microalbumin / creatinine urine ratio -     Hemoglobin A1c  Primary hypertension -     CMP14+EGFR -     CBC with Differential/Platelet  Screening for lipid disorders -     Lipid panel  Vitamin D deficiency -     VITAMIN D 25 Hydroxy (Vit-D Deficiency, Fractures)  Chronic pain of right knee Assessment & Plan: Right knee Xray ordered- awaiting results will follow up Explained to patient Non pharmacological interventions include the use of ice or heat, rest, recommend range of motion exercises, gentle stretching. The use of NSAIDs for pain management.  Follow up for worsening or persistent symptoms. Patient verbalizes understanding regarding plan of care and all questions answered.   Orders: -     DG Knee 1-2 Views Right; Future  Essential hypertension Assessment & Plan: Vitals:   11/15/22 0827 11/15/22 0850  BP: 124/77 120/75  Patient blood pressure within desired limits in today's visit Continue amlodipine 5 mg and lisinopril-hctz 20-25 mg daily Continued discussion on DASH diet, low sodium diet and maintain a exercise routine for 150 minutes per week.    Mixed hyperlipidemia Assessment & Plan: LDL 88, with LDL <70, Patient reported taking Lipitor 40 mg daily, Labs ordered- awaiting results will follow up.  Continued discussion adhering to low-fat diet with strong emphasize on nutrition and exercise. Read food labels to know how many calories are in each serving , Increase water intake 2-3 L a day. Include more protein intake such as lean meat, poultry, fish. Increase fiber intake such as vegetables, whole grains, fruits, artichokes, green peas, broccoli, lentils and lima beans.      Return in about 3 months (around 02/15/2023),  or if symptoms worsen or fail to improve, for chronic follow-up, diabetes, hypertension, hyperlipidemia/ high cholestrol.   Cruzita Lederer Newman Nip, FNP

## 2022-11-16 LAB — CMP14+EGFR
ALT: 26 IU/L (ref 0–32)
Albumin/Globulin Ratio: 1.7 (ref 1.2–2.2)
Alkaline Phosphatase: 153 IU/L — ABNORMAL HIGH (ref 44–121)
BUN: 16 mg/dL (ref 8–27)
Bilirubin Total: 0.3 mg/dL (ref 0.0–1.2)
Calcium: 9.6 mg/dL (ref 8.7–10.3)
Creatinine, Ser: 0.74 mg/dL (ref 0.57–1.00)
Total Protein: 7 g/dL (ref 6.0–8.5)

## 2022-11-16 LAB — CBC WITH DIFFERENTIAL/PLATELET
Hematocrit: 39.5 % (ref 34.0–46.6)
Lymphocytes Absolute: 2 10*3/uL (ref 0.7–3.1)
Lymphs: 22 %
MCV: 81 fL (ref 79–97)
Monocytes Absolute: 0.5 10*3/uL (ref 0.1–0.9)
Neutrophils Absolute: 6.1 10*3/uL (ref 1.4–7.0)
WBC: 8.9 10*3/uL (ref 3.4–10.8)

## 2022-11-16 LAB — LIPID PANEL
Cholesterol, Total: 158 mg/dL (ref 100–199)
HDL: 49 mg/dL (ref >39)
VLDL Cholesterol Cal: 19 mg/dL (ref 5–40)

## 2022-11-16 LAB — MICROALBUMIN / CREATININE URINE RATIO

## 2022-11-17 LAB — CBC WITH DIFFERENTIAL/PLATELET
Basophils Absolute: 0.1 10*3/uL (ref 0.0–0.2)
Basos: 1 %
EOS (ABSOLUTE): 0.2 10*3/uL (ref 0.0–0.4)
Eos: 2 %
Hemoglobin: 12.4 g/dL (ref 11.1–15.9)
Immature Grans (Abs): 0 10*3/uL (ref 0.0–0.1)
Immature Granulocytes: 0 %
MCH: 25.5 pg — ABNORMAL LOW (ref 26.6–33.0)
MCHC: 31.4 g/dL — ABNORMAL LOW (ref 31.5–35.7)
Monocytes: 6 %
Neutrophils: 69 %
Platelets: 220 10*3/uL (ref 150–450)
RBC: 4.87 x10E6/uL (ref 3.77–5.28)
RDW: 14.6 % (ref 11.7–15.4)

## 2022-11-17 LAB — MICROALBUMIN / CREATININE URINE RATIO
Microalb/Creat Ratio: 50 mg/g creat — ABNORMAL HIGH (ref 0–29)
Microalbumin, Urine: 43 ug/mL

## 2022-11-17 LAB — CMP14+EGFR
AST: 21 IU/L (ref 0–40)
Albumin: 4.4 g/dL (ref 3.9–4.9)
BUN/Creatinine Ratio: 22 (ref 12–28)
CO2: 28 mmol/L (ref 20–29)
Chloride: 101 mmol/L (ref 96–106)
Globulin, Total: 2.6 g/dL (ref 1.5–4.5)
Glucose: 103 mg/dL — ABNORMAL HIGH (ref 70–99)
Potassium: 4.2 mmol/L (ref 3.5–5.2)
Sodium: 140 mmol/L (ref 134–144)
eGFR: 91 mL/min/{1.73_m2} (ref >59)

## 2022-11-17 LAB — VITAMIN D 25 HYDROXY (VIT D DEFICIENCY, FRACTURES): Vit D, 25-Hydroxy: 26.9 ng/mL — ABNORMAL LOW (ref 30.0–100.0)

## 2022-11-17 LAB — LIPID PANEL
Chol/HDL Ratio: 3.2 ratio (ref 0.0–4.4)
LDL Chol Calc (NIH): 90 mg/dL (ref 0–99)
Triglycerides: 104 mg/dL (ref 0–149)

## 2022-11-17 LAB — HEMOGLOBIN A1C
Est. average glucose Bld gHb Est-mCnc: 163 mg/dL
Hgb A1c MFr Bld: 7.3 % — ABNORMAL HIGH (ref 4.8–5.6)

## 2022-12-02 DIAGNOSIS — H5213 Myopia, bilateral: Secondary | ICD-10-CM | POA: Diagnosis not present

## 2022-12-05 ENCOUNTER — Telehealth: Payer: Self-pay | Admitting: Family Medicine

## 2022-12-05 MED ORDER — LEVOTHYROXINE SODIUM 25 MCG PO TABS: ORAL_TABLET | ORAL | 1 refills | Status: DC

## 2022-12-05 NOTE — Telephone Encounter (Signed)
Prescription Request  12/05/2022  LOV: 11/15/2022  What is the name of the medication or equipment? levothyroxine (SYNTHROID) 25 MCG tablet   Have you contacted your pharmacy to request a refill? Yes   Which pharmacy would you like this sent to?   [facility] [address] [place] - [address] [place] [address] Phone: [phone] Fax: [phone]      Patient notified that their request is being sent to the clinical staff for review and that they should receive a response within 2 business days.   Please advise at [facility] [phone]

## 2022-12-09 ENCOUNTER — Other Ambulatory Visit: Payer: Self-pay

## 2022-12-09 ENCOUNTER — Telehealth: Payer: Self-pay | Admitting: Family Medicine

## 2022-12-09 DIAGNOSIS — E039 Hypothyroidism, unspecified: Secondary | ICD-10-CM

## 2022-12-09 MED ORDER — AMLODIPINE BESYLATE 5 MG PO TABS: 5.0000 mg | ORAL_TABLET | Freq: Every day | ORAL | 1 refills | Status: DC

## 2022-12-09 MED ORDER — LISINOPRIL-HYDROCHLOROTHIAZIDE 20-25 MG PO TABS: ORAL_TABLET | ORAL | 1 refills | Status: DC

## 2022-12-09 MED ORDER — LEVOTHYROXINE SODIUM 25 MCG PO TABS
ORAL_TABLET | ORAL | 1 refills | Status: DC
Start: 2022-12-09 — End: 2023-02-19

## 2022-12-09 MED ORDER — LEVOTHYROXINE SODIUM 25 MCG PO TABS
ORAL_TABLET | ORAL | 1 refills | Status: DC
Start: 2022-12-09 — End: 2022-12-09

## 2022-12-09 MED ORDER — SITAGLIPTIN PHOSPHATE 100 MG PO TABS: ORAL_TABLET | ORAL | 1 refills | Status: DC

## 2022-12-09 NOTE — Telephone Encounter (Signed)
Prescription Request  12/09/2022  LOV: 11/15/2022  What is the name of the medication or equipment? atorvastatin (LIPITOR) 40 MG tablet   levothyroxine (SYNTHROID) 25 MCG tablet   lisinopril-hydrochlorothiazide (ZESTORETIC) 20-25 MG tablet   sitaGLIPtin (JANUVIA) 100 MG tablet   amLODipine (NORVASC) 5 MG tablet   Have you contacted your pharmacy to request a refill? Yes   Which pharmacy would you like this sent to?   [facility] [address], [place] - [address] [place] [address] Phone: [phone] Fax: [phone]   Patient notified that their request is being sent to the clinical staff for review and that they should receive a response within 2 business days.   Please advise at [facility] [phone]

## 2022-12-10 ENCOUNTER — Other Ambulatory Visit: Payer: Self-pay | Admitting: Family Medicine

## 2022-12-10 DIAGNOSIS — E119 Type 2 diabetes mellitus without complications: Secondary | ICD-10-CM

## 2022-12-20 ENCOUNTER — Telehealth: Payer: Self-pay | Admitting: Family Medicine

## 2022-12-20 ENCOUNTER — Other Ambulatory Visit: Payer: Self-pay

## 2022-12-20 MED ORDER — ATORVASTATIN CALCIUM 40 MG PO TABS: ORAL_TABLET | ORAL | 0 refills | Status: DC

## 2022-12-20 NOTE — Telephone Encounter (Signed)
Med refilled.

## 2022-12-20 NOTE — Telephone Encounter (Signed)
Prescription Request  12/20/2022  LOV: 11/15/2022  What is the name of the medication or equipment? atorvastatin (LIPITOR) 40 MG tablet   Have you contacted your pharmacy to request a refill? Yes   Which pharmacy would you like this sent to?   [facility] [address] [place] - [address] [place] [address] Phone: [phone] Fax: [phone]     Patient notified that their request is being sent to the clinical staff for review and that they should receive a response within 2 business days.   Please advise at [facility] [phone] (mobile)

## 2023-01-10 ENCOUNTER — Other Ambulatory Visit: Payer: Self-pay | Admitting: Family Medicine

## 2023-01-10 DIAGNOSIS — Z794 Long term (current) use of insulin: Secondary | ICD-10-CM

## 2023-02-15 ENCOUNTER — Ambulatory Visit: Payer: Medicaid Other | Admitting: Family Medicine

## 2023-02-15 ENCOUNTER — Encounter: Payer: Self-pay | Admitting: Family Medicine

## 2023-02-15 VITALS — BP 129/68 | HR 76 | Ht 61.0 in | Wt 184.0 lb

## 2023-02-15 DIAGNOSIS — E119 Type 2 diabetes mellitus without complications: Secondary | ICD-10-CM

## 2023-02-15 DIAGNOSIS — E782 Mixed hyperlipidemia: Secondary | ICD-10-CM | POA: Diagnosis not present

## 2023-02-15 DIAGNOSIS — Z794 Long term (current) use of insulin: Secondary | ICD-10-CM

## 2023-02-15 DIAGNOSIS — R748 Abnormal levels of other serum enzymes: Secondary | ICD-10-CM

## 2023-02-15 DIAGNOSIS — E039 Hypothyroidism, unspecified: Secondary | ICD-10-CM | POA: Diagnosis not present

## 2023-02-15 DIAGNOSIS — I10 Essential (primary) hypertension: Secondary | ICD-10-CM | POA: Diagnosis not present

## 2023-02-15 MED ORDER — LANTUS SOLOSTAR 100 UNIT/ML ~~LOC~~ SOPN
PEN_INJECTOR | SUBCUTANEOUS | 0 refills | Status: DC
Start: 2023-02-15 — End: 2023-03-17

## 2023-02-15 MED ORDER — CITALOPRAM HYDROBROMIDE 20 MG PO TABS: ORAL_TABLET | ORAL | 1 refills | Status: DC

## 2023-02-15 NOTE — Assessment & Plan Note (Signed)
Last reading Hemoglobin A1C 7.3, Labs ordered today awaiting results will follow up. Patient reports taking Lantus Solostar 50 units at bedtime, metformin 1000 twice daily and Januvia 100 mg once daily                  Discussed medication desired effects, potential side effects, and how to administer the medication. Nonpharmacological interventions such as low carb diet,high in protein, vegetables and fruit discussed. Educated on importance of physical activity 150 minutes per week. Discussed signs and symptoms of hypoglycemia, & hyperglycemia and need to present to the ED if symptoms occurs.Follow up in 3 months or sooner if needed. Patient verbalizes understanding regarding plan of care and all questions answered. Ophthalmology current , Foot exam within desired limits

## 2023-02-15 NOTE — Assessment & Plan Note (Signed)
Vitals:   02/15/23 0848 02/15/23 0920  BP: 134/79 129/68  Blood pressure controlled in today's visit, Labs ordered. Continue Lisinopril-hydrochlorothiazide 20-25 mg once daily Continued discussion on DASH diet, low sodium diet and maintain a exercise routine for 150 minutes per week.

## 2023-02-15 NOTE — Patient Instructions (Signed)

## 2023-02-15 NOTE — Progress Notes (Signed)
Patient Office Visit   Subjective   Patient ID: Ashley Chase, female    DOB: 29-Apr-1961  Age: 62 y.o. MRN: [id number]  CC:  Chief Complaint  Patient presents with   Follow-up    Patient is here for chronic f/u. No changes or concerns since last visit.     HPI Ashley Chase 62 year old female presents to the clinic for chronic follow up She  has a past medical history of Depression, Diabetes mellitus without complication (HCC), Hyperlipidemia, and Hypertension.For the details of today's visit, please refer to assessment and plan.   Diabetes She presents for her follow-up diabetic visit. She has type 2 diabetes mellitus. There are no hypoglycemic associated symptoms. Pertinent negatives for hypoglycemia include no dizziness or headaches. Pertinent negatives for diabetes include no blurred vision, no chest pain, no fatigue, no foot paresthesias, no foot ulcerations and no visual change. There are no hypoglycemic complications. Risk factors for coronary artery disease include dyslipidemia, hypertension and obesity. Current diabetic treatment includes diet, insulin injections and oral agent (dual therapy). She is compliant with treatment all of the time. She is currently taking insulin at bedtime. Insulin injections are given by patient. Rotation sites for injection include the abdominal wall. Her weight is fluctuating minimally. She is following a generally healthy diet. Meal planning includes avoidance of concentrated sweets. She has not had a previous visit with a dietitian. She participates in exercise every other day. Her home blood glucose trend is fluctuating minimally. Her breakfast blood glucose range is generally 70-90 mg/dl. Her lunch blood glucose range is generally 110-130 mg/dl. Her dinner blood glucose range is generally 110-130 mg/dl. An ACE inhibitor/angiotensin II receptor blocker is being taken. She does not see a podiatrist.Eye exam is current.      Outpatient Encounter  Medications as of 02/15/2023  Medication Sig   atorvastatin (LIPITOR) 40 MG tablet TAKE 1 Tablet BY MOUTH ONCE EVERY DAY   Blood Glucose Monitoring Suppl DEVI 1 each by Does not apply route in the morning, at noon, and at bedtime. May substitute to any manufacturer covered by patient's insurance.   cetirizine (ZYRTEC) 10 MG tablet Take 10 mg by mouth daily.   chlorhexidine (PERIDEX) 0.12 % solution Use as directed 15 mLs in the mouth or throat 2 (two) times daily.   Lancets (FREESTYLE) lancets Use as instructed   levothyroxine (SYNTHROID) 25 MCG tablet TAKE 1 Tablet BY MOUTH ONCE EVERY DAY BEFORE BREAKFAST   lisinopril-hydrochlorothiazide (ZESTORETIC) 20-25 MG tablet TAKE 1 Tablet BY MOUTH ONCE EVERY DAY   metFORMIN (GLUCOPHAGE) 1000 MG tablet Take 1 tablet (1,000 mg total) by mouth 2 (two) times daily with a meal.   Omega-3 Fatty Acids (FISH OIL) 1000 MG CAPS 1 po qd   sitaGLIPtin (JANUVIA) 100 MG tablet TAKE 1 Tablet BY MOUTH ONCE EVERY DAY   [DISCONTINUED] amLODipine (NORVASC) 5 MG tablet Take 1 tablet (5 mg total) by mouth daily.   [DISCONTINUED] citalopram (CELEXA) 20 MG tablet TAKE 1 Tablet BY MOUTH ONCE EVERY DAY   [DISCONTINUED] LANTUS SOLOSTAR 100 UNIT/ML Solostar Pen INJECT 50 UNITS SUBCUTANEOUSLY AT BEDTIME   citalopram (CELEXA) 20 MG tablet TAKE 1 Tablet BY MOUTH ONCE EVERY DAY   LANTUS SOLOSTAR 100 UNIT/ML Solostar Pen INJECT 50 UNITS SUBCUTANEOUSLY AT BEDTIME   No facility-administered encounter medications on file as of 02/15/2023.    Past Surgical History:  Procedure Laterality Date   ECTOPIC PREGNANCY SURGERY     one ovary removed    Review  of Systems  Constitutional:  Negative for chills, fatigue and fever.  Eyes:  Negative for blurred vision.  Respiratory:  Negative for shortness of breath.   Cardiovascular:  Negative for chest pain.  Gastrointestinal:  Negative for abdominal pain.  Genitourinary:  Negative for dysuria.  Musculoskeletal:  Negative for myalgias.   Neurological:  Negative for dizziness and headaches.      Objective    BP 129/68   Pulse 76   Ht 5\' 1"  (1.549 m)   Wt 184 lb (83.5 kg)   SpO2 94%   BMI 34.77 kg/m   Physical Exam Vitals reviewed.  Constitutional:      General: She is not in acute distress.    Appearance: Normal appearance. She is not ill-appearing, toxic-appearing or diaphoretic.  HENT:     Head: Normocephalic.  Eyes:     General:        Right eye: No discharge.        Left eye: No discharge.     Conjunctiva/sclera: Conjunctivae normal.  Cardiovascular:     Rate and Rhythm: Normal rate.     Pulses: Normal pulses.     Heart sounds: Normal heart sounds.  Pulmonary:     Effort: Pulmonary effort is normal. No respiratory distress.     Breath sounds: Normal breath sounds.  Abdominal:     General: Bowel sounds are normal.     Palpations: Abdomen is soft.     Tenderness: There is no abdominal tenderness. There is no guarding.  Musculoskeletal:        General: Normal range of motion.     Cervical back: Normal range of motion.  Skin:    General: Skin is warm and dry.     Capillary Refill: Capillary refill takes less than 2 seconds.  Neurological:     General: No focal deficit present.     Mental Status: She is alert and oriented to person, place, and time.     Coordination: Coordination normal.     Gait: Gait normal.  Psychiatric:        Mood and Affect: Mood normal.        Behavior: Behavior normal.       Assessment & Plan:  Mixed hyperlipidemia -     Lipid panel  Primary hypertension -     CMP14+EGFR -     CBC with Differential/Platelet  Alkaline phosphatase elevation -     Gamma GT  Acquired hypothyroidism -     TSH + free T4  Type 2 diabetes mellitus without complication, with long-term current use of insulin (HCC) Assessment & Plan: Last reading Hemoglobin A1C 7.3, Labs ordered today awaiting results will follow up. Patient reports taking Lantus Solostar 50 units at bedtime,  metformin 1000 twice daily and Januvia 100 mg once daily                  Discussed medication desired effects, potential side effects, and how to administer the medication. Nonpharmacological interventions such as low carb diet,high in protein, vegetables and fruit discussed. Educated on importance of physical activity 150 minutes per week. Discussed signs and symptoms of hypoglycemia, & hyperglycemia and need to present to the ED if symptoms occurs.Follow up in 3 months or sooner if needed. Patient verbalizes understanding regarding plan of care and all questions answered. Ophthalmology current , Foot exam within desired limits   Orders: -     Hemoglobin A1c -     Lantus SoloStar; INJECT 50 UNITS  SUBCUTANEOUSLY AT BEDTIME  Dispense: 15 mL; Refill: 0  Essential hypertension Assessment & Plan: Vitals:   02/15/23 0848 02/15/23 0920  BP: 134/79 129/68  Blood pressure controlled in today's visit, Labs ordered. Continue Lisinopril-hydrochlorothiazide 20-25 mg once daily Continued discussion on DASH diet, low sodium diet and maintain a exercise routine for 150 minutes per week.     Other orders -     Citalopram Hydrobromide; TAKE 1 Tablet BY MOUTH ONCE EVERY DAY  Dispense: 90 tablet; Refill: 1    Return in about 4 months (around 06/17/2023), or if symptoms worsen or fail to improve, for type 2 diabetes, hypertension, hypothyroid.   Cruzita Lederer Newman Nip, FNP

## 2023-02-16 ENCOUNTER — Other Ambulatory Visit: Payer: Self-pay | Admitting: Family Medicine

## 2023-02-16 LAB — TSH+FREE T4
Free T4: 1.07 ng/dL (ref 0.82–1.77)
TSH: 4.02 u[IU]/mL (ref 0.450–4.500)

## 2023-02-16 LAB — CMP14+EGFR
ALT: 21 IU/L (ref 0–32)
AST: 21 IU/L (ref 0–40)
Albumin: 4.4 g/dL (ref 3.9–4.9)
Alkaline Phosphatase: 156 IU/L — ABNORMAL HIGH (ref 44–121)
BUN/Creatinine Ratio: 19 (ref 12–28)
BUN: 13 mg/dL (ref 8–27)
Bilirubin Total: 0.3 mg/dL (ref 0.0–1.2)
CO2: 28 mmol/L (ref 20–29)
Calcium: 9.6 mg/dL (ref 8.7–10.3)
Chloride: 99 mmol/L (ref 96–106)
Creatinine, Ser: 0.67 mg/dL (ref 0.57–1.00)
Globulin, Total: 2.6 g/dL (ref 1.5–4.5)
Glucose: 84 mg/dL (ref 70–99)
Potassium: 4.1 mmol/L (ref 3.5–5.2)
Sodium: 142 mmol/L (ref 134–144)
Total Protein: 7 g/dL (ref 6.0–8.5)
eGFR: 99 mL/min/{1.73_m2} (ref >59)

## 2023-02-16 LAB — CBC WITH DIFFERENTIAL/PLATELET
Basophils Absolute: 0.1 10*3/uL (ref 0.0–0.2)
Basos: 1 %
EOS (ABSOLUTE): 0.2 10*3/uL (ref 0.0–0.4)
Eos: 3 %
Hematocrit: 38.1 % (ref 34.0–46.6)
Hemoglobin: 11.9 g/dL (ref 11.1–15.9)
Immature Grans (Abs): 0 10*3/uL (ref 0.0–0.1)
Immature Granulocytes: 0 %
Lymphocytes Absolute: 1.8 10*3/uL (ref 0.7–3.1)
Lymphs: 21 %
MCH: 25.5 pg — ABNORMAL LOW (ref 26.6–33.0)
MCHC: 31.2 g/dL — ABNORMAL LOW (ref 31.5–35.7)
MCV: 82 fL (ref 79–97)
Monocytes Absolute: 0.5 10*3/uL (ref 0.1–0.9)
Monocytes: 6 %
Neutrophils Absolute: 5.8 10*3/uL (ref 1.4–7.0)
Neutrophils: 69 %
Platelets: 214 10*3/uL (ref 150–450)
RBC: 4.67 x10E6/uL (ref 3.77–5.28)
RDW: 14.9 % (ref 11.7–15.4)
WBC: 8.3 10*3/uL (ref 3.4–10.8)

## 2023-02-16 LAB — LIPID PANEL
Chol/HDL Ratio: 4.1 ratio (ref 0.0–4.4)
Cholesterol, Total: 174 mg/dL (ref 100–199)
HDL: 42 mg/dL (ref >39)
LDL Chol Calc (NIH): 98 mg/dL (ref 0–99)
Triglycerides: 197 mg/dL — ABNORMAL HIGH (ref 0–149)
VLDL Cholesterol Cal: 34 mg/dL (ref 5–40)

## 2023-02-16 LAB — GAMMA GT: GGT: 15 IU/L (ref 0–60)

## 2023-02-16 LAB — HEMOGLOBIN A1C
Est. average glucose Bld gHb Est-mCnc: 157 mg/dL
Hgb A1c MFr Bld: 7.1 % — ABNORMAL HIGH (ref 4.8–5.6)

## 2023-02-19 ENCOUNTER — Other Ambulatory Visit: Payer: Self-pay | Admitting: Family Medicine

## 2023-02-19 DIAGNOSIS — E039 Hypothyroidism, unspecified: Secondary | ICD-10-CM

## 2023-02-19 MED ORDER — ATORVASTATIN CALCIUM 40 MG PO TABS: ORAL_TABLET | ORAL | 0 refills | Status: DC

## 2023-02-19 MED ORDER — LEVOTHYROXINE SODIUM 25 MCG PO TABS: ORAL_TABLET | ORAL | 1 refills | Status: DC

## 2023-03-16 ENCOUNTER — Other Ambulatory Visit: Payer: Self-pay | Admitting: Family Medicine

## 2023-03-16 DIAGNOSIS — E119 Type 2 diabetes mellitus without complications: Secondary | ICD-10-CM

## 2023-04-13 ENCOUNTER — Other Ambulatory Visit: Payer: Self-pay | Admitting: Family Medicine

## 2023-04-13 DIAGNOSIS — Z794 Long term (current) use of insulin: Secondary | ICD-10-CM

## 2023-05-15 ENCOUNTER — Other Ambulatory Visit: Payer: Self-pay | Admitting: Family Medicine

## 2023-05-15 DIAGNOSIS — E119 Type 2 diabetes mellitus without complications: Secondary | ICD-10-CM

## 2023-06-06 ENCOUNTER — Other Ambulatory Visit: Payer: Self-pay | Admitting: Family Medicine

## 2023-06-09 ENCOUNTER — Encounter: Payer: Self-pay | Admitting: Family Medicine

## 2023-06-09 NOTE — Telephone Encounter (Signed)
 Care team updated and letter sent for eye exam notes.

## 2023-06-17 NOTE — Patient Instructions (Signed)

## 2023-06-17 NOTE — Progress Notes (Unsigned)
   Established Patient Office Visit   Subjective  Patient ID: Ashley Chase, female    DOB: [birth year]  Age: 62 y.o. MRN: [id number]  No chief complaint on file.   She  has a past medical history of Depression, Diabetes mellitus without complication (HCC), Hyperlipidemia, and Hypertension.  HPI  ROS    Objective:     There were no vitals taken for this visit. {Vitals History (Optional):23777}  Physical Exam   No results found for any visits on 06/19/23.  The 10-year ASCVD risk score (Arnett DK, et al., 2019) is: 12.2%    Assessment & Plan:  There are no diagnoses linked to this encounter.  No follow-ups on file.   Cruzita Lederer Newman Nip, FNP

## 2023-06-19 ENCOUNTER — Ambulatory Visit (HOSPITAL_COMMUNITY)
Admission: RE | Admit: 2023-06-19 | Discharge: 2023-06-19 | Disposition: A | Discharge status: Home / Self Care | Payer: Medicaid Other | Source: Ambulatory Visit | Attending: Family Medicine | Admitting: Family Medicine

## 2023-06-19 ENCOUNTER — Ambulatory Visit: Payer: Medicaid Other | Admitting: Family Medicine

## 2023-06-19 VITALS — BP 134/78 | HR 67 | Ht 61.0 in | Wt 183.0 lb

## 2023-06-19 DIAGNOSIS — Z1211 Encounter for screening for malignant neoplasm of colon: Secondary | ICD-10-CM | POA: Diagnosis not present

## 2023-06-19 DIAGNOSIS — I10 Essential (primary) hypertension: Secondary | ICD-10-CM

## 2023-06-19 DIAGNOSIS — Z043 Encounter for examination and observation following other accident: Secondary | ICD-10-CM | POA: Diagnosis not present

## 2023-06-19 DIAGNOSIS — Z794 Long term (current) use of insulin: Secondary | ICD-10-CM | POA: Diagnosis not present

## 2023-06-19 DIAGNOSIS — E538 Deficiency of other specified B group vitamins: Secondary | ICD-10-CM | POA: Diagnosis not present

## 2023-06-19 DIAGNOSIS — E1169 Type 2 diabetes mellitus with other specified complication: Secondary | ICD-10-CM | POA: Diagnosis not present

## 2023-06-19 DIAGNOSIS — M79642 Pain in left hand: Secondary | ICD-10-CM

## 2023-06-19 DIAGNOSIS — S6992XA Unspecified injury of left wrist, hand and finger(s), initial encounter: Secondary | ICD-10-CM

## 2023-06-19 DIAGNOSIS — E119 Type 2 diabetes mellitus without complications: Secondary | ICD-10-CM

## 2023-06-19 MED ORDER — ATORVASTATIN CALCIUM 40 MG PO TABS: ORAL_TABLET | ORAL | 1 refills | Status: DC

## 2023-06-19 MED ORDER — FUTURO LEFT HAND WRIST BRACE MISC: 1 refills | Status: DC

## 2023-06-19 MED ORDER — VITAMIN D3 50 MCG (2000 UT) PO CAPS: 2000.0000 [IU] | ORAL_CAPSULE | Freq: Every day | ORAL | 2 refills | Status: AC

## 2023-06-19 MED ORDER — LANTUS SOLOSTAR 100 UNIT/ML ~~LOC~~ SOPN: PEN_INJECTOR | SUBCUTANEOUS | 1 refills | Status: DC

## 2023-06-19 NOTE — Assessment & Plan Note (Signed)
Vitals:   06/19/23 0843 06/19/23 0933  BP: (!) 140/81 134/78  Continue Lisinopril-hydrochlorothiazide 20-25 mg once daily  Labs ordered. Discussed with  patient to monitor their blood pressure regularly and maintain a heart-healthy diet rich in fruits, vegetables, whole grains, and low-fat dairy, while reducing sodium intake to less than 2,300 mg per day. Regular physical activity, such as 30 minutes of moderate exercise most days of the week, will help lower blood pressure and improve overall cardiovascular health. Avoiding smoking, limiting alcohol consumption, and managing stress. Take  prescribed medication, & take it as directed and avoid skipping doses. Seek emergency care if your blood pressure is (over 180/100) or you experience chest pain, shortness of breath, or sudden vision changes.Patient verbalizes understanding regarding plan of care and all questions answered.

## 2023-06-19 NOTE — Assessment & Plan Note (Signed)
Xray ordered awaiting results will follow up Advise patient treatment plan for includes rest, tylenol for pain, applying ice for 15-20 minutes several times a day, Using a hand brace or compression bandage can provide support.

## 2023-06-19 NOTE — Assessment & Plan Note (Signed)
Last Hemoglobin A1c: 7.1 Labs: Ordered today, results pending; will follow up accordingly. The patient reports adhering to prescribed medications: Lantus injection 40 units at bedtime, metformin 1000 mg twice daily and Januvia100 mg once daily  Reviewed non-pharmacological interventions, including a balanced diet rich in lean proteins, healthy fats, whole grains, and high-fiber vegetables. Emphasized reducing refined sugars and processed carbohydrates, and incorporating more fruits, leafy greens, and legumes. Education: Patient was educated on recognizing signs and symptoms of both hypoglycemia and hyperglycemia, and advised to seek emergency care if these symptoms occur. Follow-Up: Scheduled for follow-up in 3-4 months, or sooner if needed. Patient Understanding: The patient verbalized understanding of the care plan, and all questions were answered. Additional Care: Ophthalmology exam current. Foot exam results were within normal limits.

## 2023-06-20 ENCOUNTER — Other Ambulatory Visit: Payer: Self-pay | Admitting: Family Medicine

## 2023-06-20 LAB — CBC WITH DIFFERENTIAL/PLATELET
Basophils Absolute: 0.1 10*3/uL (ref 0.0–0.2)
Basos: 1 %
EOS (ABSOLUTE): 0.2 10*3/uL (ref 0.0–0.4)
Eos: 2 %
Hematocrit: 37.6 % (ref 34.0–46.6)
Hemoglobin: 12.1 g/dL (ref 11.1–15.9)
Immature Grans (Abs): 0 10*3/uL (ref 0.0–0.1)
Immature Granulocytes: 0 %
Lymphocytes Absolute: 1.7 10*3/uL (ref 0.7–3.1)
Lymphs: 23 %
MCH: 26.2 pg — ABNORMAL LOW (ref 26.6–33.0)
MCHC: 32.2 g/dL (ref 31.5–35.7)
MCV: 82 fL (ref 79–97)
Monocytes Absolute: 0.4 10*3/uL (ref 0.1–0.9)
Monocytes: 6 %
Neutrophils Absolute: 5.1 10*3/uL (ref 1.4–7.0)
Neutrophils: 68 %
Platelets: 194 10*3/uL (ref 150–450)
RBC: 4.61 x10E6/uL (ref 3.77–5.28)
RDW: 14.3 % (ref 11.7–15.4)
WBC: 7.5 10*3/uL (ref 3.4–10.8)

## 2023-06-20 LAB — BMP8+EGFR
BUN/Creatinine Ratio: 17 (ref 12–28)
BUN: 12 mg/dL (ref 8–27)
CO2: 25 mmol/L (ref 20–29)
Calcium: 9.3 mg/dL (ref 8.7–10.3)
Chloride: 102 mmol/L (ref 96–106)
Creatinine, Ser: 0.72 mg/dL (ref 0.57–1.00)
Glucose: 78 mg/dL (ref 70–99)
Potassium: 4.3 mmol/L (ref 3.5–5.2)
Sodium: 144 mmol/L (ref 134–144)
eGFR: 94 mL/min/{1.73_m2} (ref >59)

## 2023-06-20 LAB — LIPID PANEL
Chol/HDL Ratio: 3.5 {ratio} (ref 0.0–4.4)
Cholesterol, Total: 142 mg/dL (ref 100–199)
HDL: 41 mg/dL (ref >39)
LDL Chol Calc (NIH): 79 mg/dL (ref 0–99)
Triglycerides: 123 mg/dL (ref 0–149)
VLDL Cholesterol Cal: 22 mg/dL (ref 5–40)

## 2023-06-20 LAB — HEMOGLOBIN A1C
Est. average glucose Bld gHb Est-mCnc: 160 mg/dL
Hgb A1c MFr Bld: 7.2 % — ABNORMAL HIGH (ref 4.8–5.6)

## 2023-06-20 LAB — VITAMIN B12: Vitamin B-12: 385 pg/mL (ref 232–1245)

## 2023-06-20 MED ORDER — GLIPIZIDE 10 MG PO TABS: 10.0000 mg | ORAL_TABLET | Freq: Two times a day (BID) | ORAL | 3 refills | Status: DC

## 2023-07-08 ENCOUNTER — Other Ambulatory Visit: Payer: Self-pay | Admitting: Family Medicine

## 2023-08-23 ENCOUNTER — Other Ambulatory Visit: Payer: Self-pay | Admitting: Family Medicine

## 2023-08-23 DIAGNOSIS — Z794 Long term (current) use of insulin: Secondary | ICD-10-CM

## 2023-09-08 ENCOUNTER — Other Ambulatory Visit: Payer: Self-pay | Admitting: Family Medicine

## 2023-09-09 ENCOUNTER — Other Ambulatory Visit: Payer: Self-pay | Admitting: Family Medicine

## 2023-10-03 ENCOUNTER — Other Ambulatory Visit: Payer: Self-pay | Admitting: Family Medicine

## 2023-10-03 DIAGNOSIS — E119 Type 2 diabetes mellitus without complications: Secondary | ICD-10-CM

## 2023-10-18 ENCOUNTER — Ambulatory Visit: Payer: Medicaid Other | Admitting: Family Medicine

## 2023-10-18 ENCOUNTER — Ambulatory Visit

## 2023-10-18 ENCOUNTER — Encounter: Payer: Self-pay | Admitting: Family Medicine

## 2023-10-18 VITALS — BP 139/78 | HR 72 | Ht 61.0 in | Wt 184.1 lb

## 2023-10-18 DIAGNOSIS — E038 Other specified hypothyroidism: Secondary | ICD-10-CM | POA: Diagnosis not present

## 2023-10-18 DIAGNOSIS — E559 Vitamin D deficiency, unspecified: Secondary | ICD-10-CM

## 2023-10-18 DIAGNOSIS — E119 Type 2 diabetes mellitus without complications: Secondary | ICD-10-CM

## 2023-10-18 DIAGNOSIS — Z7984 Long term (current) use of oral hypoglycemic drugs: Secondary | ICD-10-CM

## 2023-10-18 DIAGNOSIS — E1159 Type 2 diabetes mellitus with other circulatory complications: Secondary | ICD-10-CM

## 2023-10-18 DIAGNOSIS — M545 Low back pain, unspecified: Secondary | ICD-10-CM | POA: Diagnosis not present

## 2023-10-18 DIAGNOSIS — I10 Essential (primary) hypertension: Secondary | ICD-10-CM | POA: Diagnosis not present

## 2023-10-18 DIAGNOSIS — Z1211 Encounter for screening for malignant neoplasm of colon: Secondary | ICD-10-CM

## 2023-10-18 MED ORDER — TIZANIDINE HCL 4 MG PO TABS: 4.0000 mg | ORAL_TABLET | Freq: Two times a day (BID) | ORAL | 3 refills | Status: DC | PRN

## 2023-10-18 NOTE — Assessment & Plan Note (Signed)
 Continue Lisinopril-hydrochlorothiazide 20-25 mg once daily  Labs ordered.  Continued discussion on DASH diet, low sodium diet and maintain a exercise routine for 150 minutes per week.

## 2023-10-18 NOTE — Patient Instructions (Signed)

## 2023-10-18 NOTE — Assessment & Plan Note (Signed)
 Xray ordered awaiting result  Trial Zanaflex 4 mg PRN Discussed to focus on maintaining good posture, using lumbar support while sitting, and avoiding prolonged sitting or heavy lifting. Engage in low-impact exercises like walking or swimming to strengthen core muscles and reduce strain on the spine. Apply heat or ice packs as needed for pain relief and consider physical therapy for targeted exercises.

## 2023-10-18 NOTE — Progress Notes (Signed)
 Established Patient Office Visit   Subjective  Patient ID: Ashley Chase, female    DOB: [birth year]  Age: 63 y.o. MRN: [id number]  Chief Complaint  Patient presents with   Medical Management of Chronic Issues    4 month f/u  Since stopping metformin and starting glipizide pt. Reports having lower back pain     She  has a past medical history of Depression, Diabetes mellitus without complication (HCC), Hyperlipidemia, and Hypertension.  Patient presents with recurrent low back pain, with the current episode beginning approximately 1 to 4 weeks ago. The pain is intermittent and has been waxing and waning since onset. It is localized to the lumbar spine and described as an aching discomfort, without radiation. The pain severity is rated as 6 out of 10, and it tends to be worse during the day, particularly with standing and changes in position. The patient reports morning stiffness. Pertinent negatives include no bladder or bowel incontinence, no leg pain, numbness, pelvic pain, or weakness. Contributing risk factors include obesity, sedentary lifestyle, poor posture, and lack of regular exercise. The patient has tried NSAIDs, which provided only mild relief.    Review of Systems  Constitutional:  Negative for chills and fever.  Eyes:  Negative for blurred vision.  Respiratory:  Negative for shortness of breath.   Cardiovascular:  Negative for chest pain.  Gastrointestinal:  Negative for abdominal pain.  Musculoskeletal:  Positive for back pain.  Neurological:  Negative for dizziness, weakness and headaches.      Objective:     BP 139/78   Pulse 72   Ht 5\' 1"  (1.549 m)   Wt 184 lb 1.3 oz (83.5 kg)   SpO2 98%   BMI 34.78 kg/m  BP Readings from Last 3 Encounters:  10/18/23 139/78  06/19/23 134/78  02/15/23 129/68      Physical Exam Vitals reviewed.  Constitutional:      General: She is not in acute distress.    Appearance: Normal appearance. She is not ill-appearing,  toxic-appearing or diaphoretic.  HENT:     Head: Normocephalic.  Eyes:     General:        Right eye: No discharge.        Left eye: No discharge.     Conjunctiva/sclera: Conjunctivae normal.  Cardiovascular:     Rate and Rhythm: Normal rate.     Pulses: Normal pulses.     Heart sounds: Normal heart sounds.  Pulmonary:     Effort: Pulmonary effort is normal. No respiratory distress.     Breath sounds: Normal breath sounds.  Abdominal:     General: Bowel sounds are normal.     Palpations: Abdomen is soft.     Tenderness: There is no abdominal tenderness. There is no right CVA tenderness, left CVA tenderness or guarding.  Musculoskeletal:     Cervical back: Normal range of motion.     Thoracic back: Decreased range of motion.     Lumbar back: Decreased range of motion. Positive right straight leg raise test and positive left straight leg raise test.  Skin:    General: Skin is warm and dry.     Capillary Refill: Capillary refill takes less than 2 seconds.  Neurological:     Mental Status: She is alert.     Coordination: Coordination normal.     Gait: Gait normal.  Psychiatric:        Mood and Affect: Mood normal.        Behavior:  Behavior normal.      No results found for any visits on 10/18/23.  The 10-year ASCVD risk score (Arnett DK, et al., 2019) is: 12.5%    Assessment & Plan:  Type 2 diabetes mellitus without complication, without long-term current use of insulin (HCC) Assessment & Plan: Last Hemoglobin A1c: 7.2 Labs: Ordered today, results pending; will follow up accordingly. The patient reports adhering to prescribed medications: Glipizide 10 mg 2 times daily, Januvia 100 mg once daily  Reviewed non-pharmacological interventions, including a balanced diet rich in lean proteins, healthy fats, whole grains, and high-fiber vegetables. Emphasized reducing refined sugars and processed carbohydrates, and incorporating more fruits, leafy greens, and legumes. Education:  Patient was educated on recognizing signs and symptoms of both hypoglycemia and hyperglycemia, and advised to seek emergency care if these symptoms occur. Follow-Up: Scheduled for follow-up in 3-4 months, or sooner if needed. Patient Understanding: The patient verbalized understanding of the care plan, and all questions were answered. Additional Care: Ophthalmology current, Foot exam results were within normal limits.   Orders: -     Hemoglobin A1c  Primary hypertension -     BMP8+eGFR -     Lipid panel -     CBC with Differential/Platelet  TSH (thyroid-stimulating hormone deficiency) -     TSH + free T4  Vitamin D deficiency -     VITAMIN D 25 Hydroxy (Vit-D Deficiency, Fractures)  Lumbar pain Assessment & Plan: Xray ordered awaiting result  Trial Zanaflex 4 mg PRN Discussed to focus on maintaining good posture, using lumbar support while sitting, and avoiding prolonged sitting or heavy lifting. Engage in low-impact exercises like walking or swimming to strengthen core muscles and reduce strain on the spine. Apply heat or ice packs as needed for pain relief and consider physical therapy for targeted exercises.   Orders: -     tiZANidine HCl; Take 1 tablet (4 mg total) by mouth 2 (two) times daily as needed for muscle spasms.  Dispense: 60 tablet; Refill: 3 -     DG Lumbar Spine 2-3 Views; Future  Essential hypertension Assessment & Plan: Continue Lisinopril-hydrochlorothiazide 20-25 mg once daily  Labs ordered.  Continued discussion on DASH diet, low sodium diet and maintain a exercise routine for 150 minutes per week.      Return in about 4 months (around 02/17/2024), or if symptoms worsen or fail to improve, for chronic follow-up.   Avelino Lek Amber Bail, FNP

## 2023-10-18 NOTE — Assessment & Plan Note (Signed)
 Last Hemoglobin A1c: 7.2 Labs: Ordered today, results pending; will follow up accordingly. The patient reports adhering to prescribed medications: Glipizide 10 mg 2 times daily, Januvia 100 mg once daily  Reviewed non-pharmacological interventions, including a balanced diet rich in lean proteins, healthy fats, whole grains, and high-fiber vegetables. Emphasized reducing refined sugars and processed carbohydrates, and incorporating more fruits, leafy greens, and legumes. Education: Patient was educated on recognizing signs and symptoms of both hypoglycemia and hyperglycemia, and advised to seek emergency care if these symptoms occur. Follow-Up: Scheduled for follow-up in 3-4 months, or sooner if needed. Patient Understanding: The patient verbalized understanding of the care plan, and all questions were answered. Additional Care: Ophthalmology current, Foot exam results were within normal limits.

## 2023-10-19 ENCOUNTER — Other Ambulatory Visit: Payer: Self-pay | Admitting: Family Medicine

## 2023-10-19 ENCOUNTER — Encounter: Payer: Self-pay | Admitting: Family Medicine

## 2023-10-19 LAB — LIPID PANEL
Chol/HDL Ratio: 3.8 ratio (ref 0.0–4.4)
Cholesterol, Total: 173 mg/dL (ref 100–199)
HDL: 46 mg/dL (ref >39)
LDL Chol Calc (NIH): 99 mg/dL (ref 0–99)
Triglycerides: 162 mg/dL — ABNORMAL HIGH (ref 0–149)
VLDL Cholesterol Cal: 28 mg/dL (ref 5–40)

## 2023-10-19 LAB — CBC WITH DIFFERENTIAL/PLATELET
Basophils Absolute: 0.1 10*3/uL (ref 0.0–0.2)
Basos: 1 %
EOS (ABSOLUTE): 0.3 10*3/uL (ref 0.0–0.4)
Eos: 4 %
Hematocrit: 37.5 % (ref 34.0–46.6)
Hemoglobin: 11.5 g/dL (ref 11.1–15.9)
Immature Grans (Abs): 0 10*3/uL (ref 0.0–0.1)
Immature Granulocytes: 0 %
Lymphocytes Absolute: 1.7 10*3/uL (ref 0.7–3.1)
Lymphs: 22 %
MCH: 24.9 pg — ABNORMAL LOW (ref 26.6–33.0)
MCHC: 30.7 g/dL — ABNORMAL LOW (ref 31.5–35.7)
MCV: 81 fL (ref 79–97)
Monocytes Absolute: 0.5 10*3/uL (ref 0.1–0.9)
Monocytes: 7 %
Neutrophils Absolute: 5 10*3/uL (ref 1.4–7.0)
Neutrophils: 66 %
Platelets: 203 10*3/uL (ref 150–450)
RBC: 4.61 x10E6/uL (ref 3.77–5.28)
RDW: 15.2 % (ref 11.7–15.4)
WBC: 7.6 10*3/uL (ref 3.4–10.8)

## 2023-10-19 LAB — BMP8+EGFR
BUN/Creatinine Ratio: 20 (ref 12–28)
BUN: 13 mg/dL (ref 8–27)
CO2: 26 mmol/L (ref 20–29)
Calcium: 9.2 mg/dL (ref 8.7–10.3)
Chloride: 102 mmol/L (ref 96–106)
Creatinine, Ser: 0.66 mg/dL (ref 0.57–1.00)
Glucose: 67 mg/dL — ABNORMAL LOW (ref 70–99)
Potassium: 4.5 mmol/L (ref 3.5–5.2)
Sodium: 142 mmol/L (ref 134–144)
eGFR: 99 mL/min/{1.73_m2} (ref >59)

## 2023-10-19 LAB — HEMOGLOBIN A1C
Est. average glucose Bld gHb Est-mCnc: 169 mg/dL
Hgb A1c MFr Bld: 7.5 % — ABNORMAL HIGH (ref 4.8–5.6)

## 2023-10-19 LAB — TSH+FREE T4
Free T4: 0.96 ng/dL (ref 0.82–1.77)
TSH: 3.4 u[IU]/mL (ref 0.450–4.500)

## 2023-10-19 LAB — VITAMIN D 25 HYDROXY (VIT D DEFICIENCY, FRACTURES): Vit D, 25-Hydroxy: 14.2 ng/mL — ABNORMAL LOW (ref 30.0–100.0)

## 2023-10-19 MED ORDER — METFORMIN HCL 500 MG PO TABS: 500.0000 mg | ORAL_TABLET | Freq: Two times a day (BID) | ORAL | 2 refills | Status: DC

## 2023-10-25 NOTE — Congregational Nurse Program (Signed)
 Pt attended Ashley Chase to pick up her groceries as it relates to practicing  Better eating habits and learning how to shop in  as it relates to self monitoring  blood pressure management

## 2023-11-06 ENCOUNTER — Other Ambulatory Visit: Payer: Self-pay | Admitting: Family Medicine

## 2023-11-06 DIAGNOSIS — Z794 Long term (current) use of insulin: Secondary | ICD-10-CM

## 2023-11-08 ENCOUNTER — Other Ambulatory Visit: Payer: Self-pay | Admitting: Family Medicine

## 2023-11-08 MED ORDER — ACCU-CHEK GUIDE W/DEVICE KIT: PACK | 1 refills | Status: AC

## 2023-11-08 MED ORDER — FREESTYLE LANCETS MISC
12 refills | Status: DC
Start: 2023-11-08 — End: 2023-11-16

## 2023-11-10 ENCOUNTER — Other Ambulatory Visit: Payer: Self-pay

## 2023-11-15 ENCOUNTER — Ambulatory Visit (INDEPENDENT_AMBULATORY_CARE_PROVIDER_SITE_OTHER)

## 2023-11-15 DIAGNOSIS — E119 Type 2 diabetes mellitus without complications: Secondary | ICD-10-CM | POA: Diagnosis not present

## 2023-11-15 LAB — HM DIABETES EYE EXAM

## 2023-11-15 NOTE — Progress Notes (Signed)
 Ashley Chase arrived 11/15/2023 and has given verbal consent to obtain images and complete their overdue diabetic retinal screening.  The images have been sent to an ophthalmologist or optometrist for review and interpretation.  Results will be sent back to Del Amber Bail, [place], FNP for review.  Patient has been informed they will be contacted when we receive the results via telephone or MyChart

## 2023-11-16 ENCOUNTER — Other Ambulatory Visit: Payer: Self-pay | Admitting: Family Medicine

## 2023-11-16 DIAGNOSIS — E119 Type 2 diabetes mellitus without complications: Secondary | ICD-10-CM

## 2023-11-16 MED ORDER — ACCU-CHEK SOFTCLIX LANCETS MISC: 12 refills | Status: AC

## 2023-12-05 ENCOUNTER — Other Ambulatory Visit: Payer: Self-pay | Admitting: Family Medicine

## 2023-12-05 DIAGNOSIS — Z794 Long term (current) use of insulin: Secondary | ICD-10-CM

## 2024-01-03 ENCOUNTER — Other Ambulatory Visit: Payer: Self-pay | Admitting: Family Medicine

## 2024-01-03 DIAGNOSIS — Z794 Long term (current) use of insulin: Secondary | ICD-10-CM

## 2024-02-01 ENCOUNTER — Other Ambulatory Visit: Payer: Self-pay | Admitting: Family Medicine

## 2024-02-01 DIAGNOSIS — E119 Type 2 diabetes mellitus without complications: Secondary | ICD-10-CM

## 2024-02-22 ENCOUNTER — Encounter: Payer: Self-pay | Admitting: Family Medicine

## 2024-02-22 ENCOUNTER — Ambulatory Visit: Admitting: Family Medicine

## 2024-02-22 VITALS — BP 129/86 | HR 76 | Resp 16 | Ht 61.0 in | Wt 188.1 lb

## 2024-02-22 DIAGNOSIS — I10 Essential (primary) hypertension: Secondary | ICD-10-CM | POA: Diagnosis not present

## 2024-02-22 DIAGNOSIS — E038 Other specified hypothyroidism: Secondary | ICD-10-CM | POA: Diagnosis not present

## 2024-02-22 DIAGNOSIS — L409 Psoriasis, unspecified: Secondary | ICD-10-CM | POA: Diagnosis not present

## 2024-02-22 DIAGNOSIS — E119 Type 2 diabetes mellitus without complications: Secondary | ICD-10-CM | POA: Diagnosis not present

## 2024-02-22 DIAGNOSIS — Z794 Long term (current) use of insulin: Secondary | ICD-10-CM

## 2024-02-22 MED ORDER — LANTUS SOLOSTAR 100 UNIT/ML ~~LOC~~ SOPN: PEN_INJECTOR | SUBCUTANEOUS | 1 refills | Status: DC

## 2024-02-22 MED ORDER — CITALOPRAM HYDROBROMIDE 20 MG PO TABS: 20.0000 mg | ORAL_TABLET | Freq: Every day | ORAL | 1 refills | Status: AC

## 2024-02-22 MED ORDER — GLUCOSE BLOOD VI STRP: ORAL_STRIP | 12 refills | Status: DC

## 2024-02-22 MED ORDER — LISINOPRIL-HYDROCHLOROTHIAZIDE 20-25 MG PO TABS: 1.0000 | ORAL_TABLET | Freq: Every day | ORAL | 1 refills | Status: AC

## 2024-02-22 NOTE — Assessment & Plan Note (Signed)
 Vitals:   02/22/24 0852 02/22/24 0927  BP: (!) 147/84 129/86  Continue Lisinopril -hydrochlorothiazide  20-25 mg once daily  Labs ordered.  Continued discussion on DASH diet, low sodium diet and maintain a exercise routine for 150 minutes per week.

## 2024-02-22 NOTE — Progress Notes (Signed)
 Established Patient Office Visit   Subjective  Patient ID: Ashley Chase, female    DOB: 1960/10/26  Age: 63 y.o. MRN: [id number]  Chief Complaint  Patient presents with   Follow-up    States she lost her best friend a few months ago and has a lot going on but doesn't want to increase her antidepressant for fear of weight gain     She  has a past medical history of Depression, Diabetes mellitus without complication (HCC), Hyperlipidemia, and Hypertension.  Diabetes She has type 2 diabetes mellitus. Her disease course has been fluctuating. Pertinent negatives for diabetes include no blurred vision, no chest pain and no foot ulcerations. Pertinent negatives for hypoglycemia complications include no blackouts. Diabetic complications include peripheral neuropathy. Risk factors for coronary artery disease include hypertension, obesity, diabetes mellitus and sedentary lifestyle. Current diabetic treatment includes diet, oral agent (dual therapy) and insulin  injections. She is compliant with treatment all of the time. She is following a diabetic diet. She rarely participates in exercise. Her breakfast blood glucose range is generally 110-130 mg/dl. An ACE inhibitor/angiotensin II receptor blocker is being taken. She does not see a podiatrist.Eye exam is not current.    Review of Systems  Constitutional:  Negative for chills and fever.  Eyes:  Negative for blurred vision.  Respiratory:  Negative for shortness of breath.   Cardiovascular:  Negative for chest pain.      Objective:     BP 129/86   Pulse 76   Resp 16   Ht 5' 1 (1.549 m)   Wt 188 lb 1.9 oz (85.3 kg)   SpO2 93%   BMI 35.54 kg/m  BP Readings from Last 3 Encounters:  02/22/24 129/86  10/18/23 139/78  06/19/23 134/78      Physical Exam Vitals reviewed.  Constitutional:      General: She is not in acute distress.    Appearance: Normal appearance. She is not ill-appearing, toxic-appearing or diaphoretic.  HENT:      Head: Normocephalic.  Eyes:     General:        Right eye: No discharge.        Left eye: No discharge.     Conjunctiva/sclera: Conjunctivae normal.  Cardiovascular:     Rate and Rhythm: Normal rate.     Pulses: Normal pulses.     Heart sounds: Normal heart sounds.  Pulmonary:     Effort: Pulmonary effort is normal. No respiratory distress.     Breath sounds: Normal breath sounds.  Abdominal:     General: Bowel sounds are normal.     Palpations: Abdomen is soft.     Tenderness: There is no abdominal tenderness. There is no right CVA tenderness, left CVA tenderness or guarding.  Skin:    General: Skin is warm and dry.  Neurological:     Mental Status: She is alert and oriented to person, place, and time.  Psychiatric:        Mood and Affect: Mood normal.        Behavior: Behavior normal.    Initiate a trial of Kenalog , applied twice daily for 14 days, followed by Calcipotriene , applied twice daily for the subsequent 14 days. Otezla starter pack has been initiated, and a referral to dermatology has been placed Discussed to keep your skin moisturized with thick creams or ointments to reduce dryness and irritation. Avoid triggers like stress, smoking, and harsh soaps, and use gentle products suitable for sensitive skin Referral placed to dermatology  No results found for any visits on 02/22/24.  The 10-year ASCVD risk score (Arnett DK, et al., 2019) is: 12.8%    Assessment & Plan:  Primary hypertension -     Lipid panel -     BMP8+eGFR -     CBC with Differential/Platelet  Type 2 diabetes mellitus without complication, without long-term current use of insulin  (HCC) Assessment & Plan: Last Hemoglobin A1c: 7.5  Labs: Ordered today, results pending; will follow up accordingly. The patient reports adhering to prescribed medications: Metformin  500 mg twice daily. Januvia  100 mg once daily, Lantus  DAILY 50 units  bedtime injection   Reviewed non-pharmacological interventions,  including a balanced diet rich in lean proteins, healthy fats, whole grains, and high-fiber vegetables. Emphasized reducing refined sugars and processed carbohydrates, and incorporating more fruits, leafy greens, and legumes. Education: Patient was educated on recognizing signs and symptoms of both hypoglycemia and hyperglycemia, and advised to seek emergency care if these symptoms occur. Follow-Up: Scheduled for follow-up in 3-4 months, or sooner if needed. Patient Understanding: The patient verbalized understanding of the care plan, and all questions were answered. Additional Care: Ophthalmology referral was placed. Foot exam results were within normal limits.   Orders: -     Microalbumin / creatinine urine ratio -     Hemoglobin A1c -     Ambulatory referral to Ophthalmology  TSH (thyroid -stimulating hormone deficiency) -     TSH + free T4  Type 2 diabetes mellitus without complication, with long-term current use of insulin  (HCC) Assessment & Plan: Last Hemoglobin A1c: 7.5  Labs: Ordered today, results pending; will follow up accordingly. The patient reports adhering to prescribed medications: Metformin  500 mg twice daily. Januvia  100 mg once daily, Lantus  DAILY 50 units  bedtime injection   Reviewed non-pharmacological interventions, including a balanced diet rich in lean proteins, healthy fats, whole grains, and high-fiber vegetables. Emphasized reducing refined sugars and processed carbohydrates, and incorporating more fruits, leafy greens, and legumes. Education: Patient was educated on recognizing signs and symptoms of both hypoglycemia and hyperglycemia, and advised to seek emergency care if these symptoms occur. Follow-Up: Scheduled for follow-up in 3-4 months, or sooner if needed. Patient Understanding: The patient verbalized understanding of the care plan, and all questions were answered. Additional Care: Ophthalmology referral was placed. Foot exam results were within normal  limits.   Orders: -     Lantus  SoloStar; INJECT 50 UNITS SUBCUTANEOUSLY AT BEDTIME  Dispense: 15 mL; Refill: 1  Other orders -     Citalopram  Hydrobromide; Take 1 tablet (20 mg total) by mouth daily.  Dispense: 90 tablet; Refill: 1 -     Lisinopril -hydroCHLOROthiazide ; Take 1 tablet by mouth daily.  Dispense: 90 tablet; Refill: 1 -     Glucose Blood; Use as instructed  Dispense: 100 each; Refill: 12    Return in about 4 months (around 06/23/2024), or if symptoms worsen or fail to improve, for chronic follow-up.   Hilario Kidd Wilhelmena Falter, FNP

## 2024-02-22 NOTE — Patient Instructions (Signed)

## 2024-02-22 NOTE — Assessment & Plan Note (Signed)
 Last Hemoglobin A1c: 7.5  Labs: Ordered today, results pending; will follow up accordingly. The patient reports adhering to prescribed medications: Metformin  500 mg twice daily. Januvia  100 mg once daily, Lantus  DAILY 50 units  bedtime injection   Reviewed non-pharmacological interventions, including a balanced diet rich in lean proteins, healthy fats, whole grains, and high-fiber vegetables. Emphasized reducing refined sugars and processed carbohydrates, and incorporating more fruits, leafy greens, and legumes. Education: Patient was educated on recognizing signs and symptoms of both hypoglycemia and hyperglycemia, and advised to seek emergency care if these symptoms occur. Follow-Up: Scheduled for follow-up in 3-4 months, or sooner if needed. Patient Understanding: The patient verbalized understanding of the care plan, and all questions were answered. Additional Care: Ophthalmology referral was placed. Foot exam results were within normal limits.

## 2024-02-23 ENCOUNTER — Ambulatory Visit: Payer: Self-pay | Admitting: Family Medicine

## 2024-02-23 ENCOUNTER — Other Ambulatory Visit: Payer: Self-pay | Admitting: Family Medicine

## 2024-02-23 DIAGNOSIS — E039 Hypothyroidism, unspecified: Secondary | ICD-10-CM

## 2024-02-23 MED ORDER — TRIAMCINOLONE ACETONIDE 0.1 % EX CREA: 1.0000 | TOPICAL_CREAM | Freq: Two times a day (BID) | CUTANEOUS | 0 refills | Status: AC

## 2024-02-23 MED ORDER — METFORMIN HCL 1000 MG PO TABS: 1000.0000 mg | ORAL_TABLET | Freq: Two times a day (BID) | ORAL | 2 refills | Status: AC

## 2024-02-23 MED ORDER — ATORVASTATIN CALCIUM 40 MG PO TABS: ORAL_TABLET | ORAL | 1 refills | Status: AC

## 2024-02-23 MED ORDER — LEVOTHYROXINE SODIUM 25 MCG PO TABS: ORAL_TABLET | ORAL | 1 refills | Status: AC

## 2024-02-23 MED ORDER — CALCIPOTRIENE 0.005 % EX OINT: TOPICAL_OINTMENT | Freq: Two times a day (BID) | CUTANEOUS | 0 refills | Status: DC

## 2024-02-23 NOTE — Addendum Note (Signed)
 Addended by: TERRY WILHELMENA LLOYD HILARIO on: 02/23/2024 01:54 PM   Modules accepted: Orders

## 2024-02-25 LAB — BMP8+EGFR
BUN/Creatinine Ratio: 19 (ref 12–28)
BUN: 13 mg/dL (ref 8–27)
CO2: 24 mmol/L (ref 20–29)
Calcium: 9.3 mg/dL (ref 8.7–10.3)
Chloride: 101 mmol/L (ref 96–106)
Creatinine, Ser: 0.67 mg/dL (ref 0.57–1.00)
Glucose: 102 mg/dL — ABNORMAL HIGH (ref 70–99)
Potassium: 4.2 mmol/L (ref 3.5–5.2)
Sodium: 140 mmol/L (ref 134–144)
eGFR: 98 mL/min/1.73 (ref >59)

## 2024-02-25 LAB — LIPID PANEL
Chol/HDL Ratio: 3.5 ratio (ref 0.0–4.4)
Cholesterol, Total: 151 mg/dL (ref 100–199)
HDL: 43 mg/dL (ref >39)
LDL Chol Calc (NIH): 85 mg/dL (ref 0–99)
Triglycerides: 131 mg/dL (ref 0–149)
VLDL Cholesterol Cal: 23 mg/dL (ref 5–40)

## 2024-02-25 LAB — CBC WITH DIFFERENTIAL/PLATELET
Basophils Absolute: 0.1 x10E3/uL (ref 0.0–0.2)
Basos: 1 %
EOS (ABSOLUTE): 0.3 x10E3/uL (ref 0.0–0.4)
Eos: 4 %
Hematocrit: 37.9 % (ref 34.0–46.6)
Hemoglobin: 11.4 g/dL (ref 11.1–15.9)
Immature Grans (Abs): 0 x10E3/uL (ref 0.0–0.1)
Immature Granulocytes: 0 %
Lymphocytes Absolute: 1.8 x10E3/uL (ref 0.7–3.1)
Lymphs: 23 %
MCH: 24.3 pg — ABNORMAL LOW (ref 26.6–33.0)
MCHC: 30.1 g/dL — ABNORMAL LOW (ref 31.5–35.7)
MCV: 81 fL (ref 79–97)
Monocytes Absolute: 0.5 x10E3/uL (ref 0.1–0.9)
Monocytes: 6 %
Neutrophils Absolute: 5.4 x10E3/uL (ref 1.4–7.0)
Neutrophils: 66 %
Platelets: 216 x10E3/uL (ref 150–450)
RBC: 4.7 x10E6/uL (ref 3.77–5.28)
RDW: 14.6 % (ref 11.7–15.4)
WBC: 8.1 x10E3/uL (ref 3.4–10.8)

## 2024-02-25 LAB — TSH+FREE T4
Free T4: 1.1 ng/dL (ref 0.82–1.77)
TSH: 2.83 u[IU]/mL (ref 0.450–4.500)

## 2024-02-25 LAB — MICROALBUMIN / CREATININE URINE RATIO
Creatinine, Urine: 116 mg/dL
Microalb/Creat Ratio: 13 mg/g{creat} (ref 0–29)
Microalbumin, Urine: 15.4 ug/mL

## 2024-02-25 LAB — HEMOGLOBIN A1C
Est. average glucose Bld gHb Est-mCnc: 194 mg/dL
Hgb A1c MFr Bld: 8.4 % — ABNORMAL HIGH (ref 4.8–5.6)

## 2024-02-26 ENCOUNTER — Other Ambulatory Visit: Payer: Self-pay | Admitting: Family Medicine

## 2024-02-26 ENCOUNTER — Telehealth: Payer: Self-pay | Admitting: Pharmacy Technician

## 2024-02-26 ENCOUNTER — Other Ambulatory Visit (HOSPITAL_COMMUNITY): Payer: Self-pay

## 2024-02-26 DIAGNOSIS — L409 Psoriasis, unspecified: Secondary | ICD-10-CM

## 2024-02-26 MED ORDER — CALCIPOTRIENE 0.005 % EX CREA: TOPICAL_CREAM | Freq: Two times a day (BID) | CUTANEOUS | 0 refills | Status: AC

## 2024-02-26 NOTE — Telephone Encounter (Signed)
 sent

## 2024-02-26 NOTE — Telephone Encounter (Signed)
 Pharmacy Patient Advocate Encounter   Received notification from CoverMyMeds that prior authorization for Calcipotriene  0.005% ointment is required/requested.   Insurance verification completed.   The patient is insured through [facility] .   Per test claim:  Calcipotriene  0.005% cream is preferred by the insurance.  If suggested medication is appropriate, Please send in a new RX and discontinue this one. If not, please advise as to why it's not appropriate so that we may request a Prior Authorization. Please note, some preferred medications may still require a PA.  If the suggested medications have not been trialed and there are no contraindications to their use, the PA will not be submitted, as it will not be approved.

## 2024-02-28 NOTE — Telephone Encounter (Signed)
 Thank you :)

## 2024-03-14 ENCOUNTER — Other Ambulatory Visit: Payer: Self-pay | Admitting: Family Medicine

## 2024-05-07 ENCOUNTER — Other Ambulatory Visit: Payer: Self-pay | Admitting: Family Medicine

## 2024-05-07 DIAGNOSIS — Z794 Long term (current) use of insulin: Secondary | ICD-10-CM

## 2024-06-03 ENCOUNTER — Other Ambulatory Visit: Payer: Self-pay | Admitting: Family Medicine

## 2024-06-05 ENCOUNTER — Other Ambulatory Visit: Payer: Self-pay | Admitting: Family Medicine

## 2024-06-05 DIAGNOSIS — E119 Type 2 diabetes mellitus without complications: Secondary | ICD-10-CM

## 2024-06-24 ENCOUNTER — Other Ambulatory Visit: Payer: Self-pay

## 2024-06-24 MED ORDER — GLUCOSE BLOOD VI STRP: 1.0000 | ORAL_STRIP | Freq: Three times a day (TID) | 12 refills | Status: AC

## 2024-07-10 ENCOUNTER — Other Ambulatory Visit: Payer: Self-pay | Admitting: Family Medicine

## 2024-07-10 DIAGNOSIS — Z794 Long term (current) use of insulin: Secondary | ICD-10-CM

## 2024-08-22 ENCOUNTER — Ambulatory Visit: Admitting: Family Medicine
